# Patient Record
Sex: Female | Born: 1975 | Race: Black or African American | Hispanic: No | Marital: Single | Smoking: Never smoker
Health system: Southern US, Community
[De-identification: ages and names within clinical notes are randomized; demographics above are authoritative.]

## PROBLEM LIST (undated history)

## (undated) ENCOUNTER — Inpatient Hospital Stay (HOSPITAL_COMMUNITY): Payer: Self-pay

## (undated) ENCOUNTER — Ambulatory Visit: Source: Home / Self Care

## (undated) DIAGNOSIS — R51 Headache: Secondary | ICD-10-CM

## (undated) DIAGNOSIS — R519 Headache, unspecified: Secondary | ICD-10-CM

## (undated) DIAGNOSIS — I10 Essential (primary) hypertension: Secondary | ICD-10-CM

## (undated) DIAGNOSIS — I82409 Acute embolism and thrombosis of unspecified deep veins of unspecified lower extremity: Secondary | ICD-10-CM

## (undated) HISTORY — DX: Headache: R51

## (undated) HISTORY — DX: Headache, unspecified: R51.9

## (undated) HISTORY — DX: Acute embolism and thrombosis of unspecified deep veins of unspecified lower extremity: I82.409

## (undated) HISTORY — PX: NO PAST SURGERIES: SHX2092

## (undated) HISTORY — PX: BREAST BIOPSY: SHX20

---

## 1997-11-24 ENCOUNTER — Emergency Department (HOSPITAL_COMMUNITY): Admission: EM | Admit: 1997-11-24 | Discharge: 1997-11-24 | Payer: Self-pay | Admitting: Emergency Medicine

## 1999-01-21 ENCOUNTER — Inpatient Hospital Stay (HOSPITAL_COMMUNITY): Admission: AD | Admit: 1999-01-21 | Discharge: 1999-01-21 | Payer: Self-pay | Admitting: Obstetrics & Gynecology

## 1999-02-21 ENCOUNTER — Encounter: Payer: Self-pay | Admitting: Obstetrics & Gynecology

## 1999-02-21 ENCOUNTER — Inpatient Hospital Stay (HOSPITAL_COMMUNITY): Admission: AD | Admit: 1999-02-21 | Discharge: 1999-02-21 | Payer: Self-pay | Admitting: Obstetrics & Gynecology

## 1999-04-20 ENCOUNTER — Inpatient Hospital Stay (HOSPITAL_COMMUNITY): Admission: AD | Admit: 1999-04-20 | Discharge: 1999-04-20 | Payer: Self-pay | Admitting: *Deleted

## 1999-05-31 ENCOUNTER — Ambulatory Visit (HOSPITAL_COMMUNITY): Admission: RE | Admit: 1999-05-31 | Discharge: 1999-05-31 | Payer: Self-pay | Admitting: Obstetrics

## 1999-05-31 ENCOUNTER — Encounter: Payer: Self-pay | Admitting: *Deleted

## 1999-10-02 ENCOUNTER — Encounter: Payer: Self-pay | Admitting: Obstetrics

## 1999-10-02 ENCOUNTER — Inpatient Hospital Stay (HOSPITAL_COMMUNITY): Admission: AD | Admit: 1999-10-02 | Discharge: 1999-10-02 | Payer: Self-pay | Admitting: Obstetrics

## 2001-11-12 ENCOUNTER — Other Ambulatory Visit: Admission: RE | Admit: 2001-11-12 | Discharge: 2001-11-12 | Payer: Self-pay | Admitting: Obstetrics & Gynecology

## 2003-01-09 ENCOUNTER — Other Ambulatory Visit: Admission: RE | Admit: 2003-01-09 | Discharge: 2003-01-09 | Payer: Self-pay | Admitting: Obstetrics & Gynecology

## 2003-04-23 ENCOUNTER — Emergency Department (HOSPITAL_COMMUNITY): Admission: AD | Admit: 2003-04-23 | Discharge: 2003-04-23 | Payer: Self-pay | Admitting: Family Medicine

## 2003-07-18 ENCOUNTER — Emergency Department (HOSPITAL_COMMUNITY): Admission: EM | Admit: 2003-07-18 | Discharge: 2003-07-18 | Payer: Self-pay | Admitting: Family Medicine

## 2003-07-31 ENCOUNTER — Emergency Department (HOSPITAL_COMMUNITY): Admission: EM | Admit: 2003-07-31 | Discharge: 2003-07-31 | Payer: Self-pay | Admitting: Family Medicine

## 2005-07-10 ENCOUNTER — Inpatient Hospital Stay (HOSPITAL_COMMUNITY): Admission: AD | Admit: 2005-07-10 | Discharge: 2005-07-11 | Payer: Self-pay | Admitting: Gynecology

## 2005-07-30 ENCOUNTER — Inpatient Hospital Stay (HOSPITAL_COMMUNITY): Admission: AD | Admit: 2005-07-30 | Discharge: 2005-07-30 | Payer: Self-pay | Admitting: Obstetrics and Gynecology

## 2005-10-27 ENCOUNTER — Ambulatory Visit (HOSPITAL_COMMUNITY): Admission: RE | Admit: 2005-10-27 | Discharge: 2005-10-27 | Payer: Self-pay

## 2005-11-08 ENCOUNTER — Inpatient Hospital Stay (HOSPITAL_COMMUNITY): Admission: AD | Admit: 2005-11-08 | Discharge: 2005-11-08 | Payer: Self-pay | Admitting: Obstetrics and Gynecology

## 2006-01-23 ENCOUNTER — Inpatient Hospital Stay (HOSPITAL_COMMUNITY): Admission: AD | Admit: 2006-01-23 | Discharge: 2006-01-24 | Payer: Self-pay | Admitting: Obstetrics and Gynecology

## 2006-02-03 ENCOUNTER — Inpatient Hospital Stay (HOSPITAL_COMMUNITY): Admission: AD | Admit: 2006-02-03 | Discharge: 2006-02-03 | Payer: Self-pay | Admitting: Obstetrics and Gynecology

## 2006-03-08 ENCOUNTER — Inpatient Hospital Stay (HOSPITAL_COMMUNITY): Admission: AD | Admit: 2006-03-08 | Discharge: 2006-03-08 | Payer: Self-pay | Admitting: *Deleted

## 2006-03-13 ENCOUNTER — Inpatient Hospital Stay (HOSPITAL_COMMUNITY): Admission: AD | Admit: 2006-03-13 | Discharge: 2006-03-16 | Payer: Self-pay | Admitting: Obstetrics and Gynecology

## 2007-01-05 ENCOUNTER — Ambulatory Visit: Payer: Self-pay | Admitting: Family Medicine

## 2007-01-11 ENCOUNTER — Emergency Department (HOSPITAL_COMMUNITY): Admission: EM | Admit: 2007-01-11 | Discharge: 2007-01-11 | Payer: Self-pay | Admitting: Family Medicine

## 2007-02-15 ENCOUNTER — Ambulatory Visit: Payer: Self-pay | Admitting: Family Medicine

## 2007-03-16 ENCOUNTER — Ambulatory Visit: Payer: Self-pay | Admitting: Family Medicine

## 2007-03-16 ENCOUNTER — Encounter: Payer: Self-pay | Admitting: Family Medicine

## 2007-03-16 ENCOUNTER — Other Ambulatory Visit: Admission: RE | Admit: 2007-03-16 | Discharge: 2007-03-16 | Payer: Self-pay | Admitting: Family Medicine

## 2007-03-30 ENCOUNTER — Ambulatory Visit: Payer: Self-pay | Admitting: Family Medicine

## 2007-06-21 ENCOUNTER — Ambulatory Visit: Payer: Self-pay | Admitting: Family Medicine

## 2007-06-30 ENCOUNTER — Emergency Department (HOSPITAL_COMMUNITY): Admission: EM | Admit: 2007-06-30 | Discharge: 2007-06-30 | Payer: Self-pay | Admitting: Emergency Medicine

## 2007-09-14 ENCOUNTER — Ambulatory Visit: Payer: Self-pay | Admitting: Family Medicine

## 2007-12-07 ENCOUNTER — Ambulatory Visit: Payer: Self-pay | Admitting: Family Medicine

## 2008-01-05 ENCOUNTER — Ambulatory Visit: Payer: Self-pay | Admitting: Family Medicine

## 2008-02-28 ENCOUNTER — Ambulatory Visit: Payer: Self-pay | Admitting: Family Medicine

## 2008-03-02 ENCOUNTER — Ambulatory Visit: Payer: Self-pay | Admitting: Family Medicine

## 2008-03-15 ENCOUNTER — Ambulatory Visit: Payer: Self-pay | Admitting: Family Medicine

## 2008-05-09 ENCOUNTER — Other Ambulatory Visit: Admission: RE | Admit: 2008-05-09 | Discharge: 2008-05-09 | Payer: Self-pay | Admitting: Family Medicine

## 2008-05-09 ENCOUNTER — Encounter: Payer: Self-pay | Admitting: Family Medicine

## 2008-05-09 ENCOUNTER — Ambulatory Visit: Payer: Self-pay | Admitting: Family Medicine

## 2008-05-26 ENCOUNTER — Ambulatory Visit: Payer: Self-pay | Admitting: Family Medicine

## 2008-08-17 ENCOUNTER — Ambulatory Visit: Payer: Self-pay | Admitting: Family Medicine

## 2008-08-31 ENCOUNTER — Ambulatory Visit: Payer: Self-pay | Admitting: Family Medicine

## 2008-10-20 ENCOUNTER — Ambulatory Visit: Payer: Self-pay | Admitting: Family Medicine

## 2008-10-24 ENCOUNTER — Ambulatory Visit: Payer: Self-pay | Admitting: Family Medicine

## 2008-11-08 ENCOUNTER — Ambulatory Visit: Payer: Self-pay | Admitting: Family Medicine

## 2009-01-26 ENCOUNTER — Ambulatory Visit: Payer: Self-pay | Admitting: Family Medicine

## 2009-01-30 ENCOUNTER — Ambulatory Visit: Payer: Self-pay | Admitting: Family Medicine

## 2009-03-13 ENCOUNTER — Other Ambulatory Visit: Admission: RE | Admit: 2009-03-13 | Discharge: 2009-03-13 | Payer: Self-pay | Admitting: Family Medicine

## 2009-03-13 ENCOUNTER — Ambulatory Visit: Payer: Self-pay | Admitting: Family Medicine

## 2009-05-29 ENCOUNTER — Ambulatory Visit: Payer: Self-pay | Admitting: Physician Assistant

## 2010-05-10 ENCOUNTER — Encounter (INDEPENDENT_AMBULATORY_CARE_PROVIDER_SITE_OTHER): Payer: 59 | Admitting: Medical

## 2010-05-10 ENCOUNTER — Other Ambulatory Visit (HOSPITAL_COMMUNITY)
Admission: RE | Admit: 2010-05-10 | Discharge: 2010-05-10 | Disposition: A | Discharge status: Home / Self Care | Payer: 59 | Source: Ambulatory Visit | Attending: Family Medicine | Admitting: Family Medicine

## 2010-05-10 ENCOUNTER — Other Ambulatory Visit: Payer: Self-pay | Admitting: Medical

## 2010-05-10 DIAGNOSIS — R82998 Other abnormal findings in urine: Secondary | ICD-10-CM

## 2010-05-10 DIAGNOSIS — Z124 Encounter for screening for malignant neoplasm of cervix: Secondary | ICD-10-CM | POA: Insufficient documentation

## 2010-05-10 DIAGNOSIS — Z0000 Encounter for general adult medical examination without abnormal findings: Secondary | ICD-10-CM

## 2010-05-10 DIAGNOSIS — N979 Female infertility, unspecified: Secondary | ICD-10-CM

## 2010-05-14 ENCOUNTER — Telehealth: Payer: Self-pay | Admitting: *Deleted

## 2010-05-14 NOTE — Telephone Encounter (Addendum)
Message copied by Ellsworth Lennox on Tue May 14, 2010  9:50 AM ------      Message from: Aleen Campi, DAVID      Created: Tue May 14, 2010  8:39 AM      Regarding: lab results       Lab results look good - her good chol is a little low, but blood count, lytes, liver, kidney, thyroid, cholesterol, urine culture ALL normal.  As we discussed, work on healthy diet, <1800 cal per day until she reaches her weight loss goal, get regular exercise.  We will with pap results.  Have her come in for weight check/nurse visit in 75mo.  Pt notified of lab results.  Pt will call and schedule a nurse visit.  CM, LPN

## 2010-05-15 ENCOUNTER — Telehealth: Payer: Self-pay | Admitting: *Deleted

## 2010-05-15 NOTE — Telephone Encounter (Addendum)
Message copied by Ellsworth Lennox on Wed May 15, 2010  3:22 PM ------      Message from: Aleen Campi, DAVID      Created: Wed May 15, 2010  2:37 PM       Pap normal, repeat 1-2 years  Pt informed of pap smear results.  CM, LPN

## 2010-05-17 ENCOUNTER — Other Ambulatory Visit: Payer: Self-pay | Admitting: Medical

## 2010-05-17 MED ORDER — SUMATRIPTAN SUCCINATE 100 MG PO TABS: ORAL_TABLET | ORAL | Status: DC

## 2010-05-24 NOTE — H&P (Signed)
Carmen Lewis, Carmen Lewis               ACCOUNT NO.:  [id number]   MEDICAL RECORD NO.:  [id number]          PATIENT TYPE:  MAT   LOCATION:  [facility]                          FACILITY:  [facility]   PHYSICIAN:  [place] B. Earlene Plater, M.D.  DATE OF BIRTH:  1975/03/21   DATE OF ADMISSION:  07/10/2005  DATE OF DISCHARGE:                                HISTORY & PHYSICAL   CHIEF COMPLAINT:  Lower abdominal pain.   HISTORY OF PRESENT ILLNESS:  A 35 year old African-American female, gravida  2, para 1, A1; with an uncertain last menstrual period sometime at the first  of June.  She presents with lower abdominal pain today, with associated  headaches.  She has had no bleeding.  The pain is described as crampy, 5/10  intensity.  She is otherwise healthy and has had no other medical problems.  She is just no sure if her last menses was in May or June.  No previous  history of pelvic infection, pelvic surgery, infertility or ectopic  pregnancy.   PAST MEDICAL HISTORY:  Otherwise negative.   PAST OBSTETRICAL HISTORY:  Vaginal delivery x1.  Therapeutic abortion x1.   MEDICATIONS:  None.   ALLERGIES:  NONE.   SOCIAL HISTORY:  No alcohol or tobacco or other drugs.   REVIEW OF SYSTEMS:  No urinary symptoms.  The patient states  no other  problems currently.   PHYSICAL EXAMINATION:  VITAL SIGNS:  Temperature 98.3, pulse 74,  respirations 18, blood pressure 119/64.  GENERAL:  Alert and oriented; no acute distress.  SKIN:  Warm and dry; no lesions.  ABDOMEN:  Soft, nondistended and nontender.  PELVIC:  Normal external female genitalia. Vagina and cervix are normal.  Uterus is not enlarged.  There are no adnexal masses.  The patient is  slightly tender in the left adnexal area.  Bladder base is nontender.   ASSESSMENT:  Early pregnancy, lower abdominal pain.   PLAN:  Check quantitative HCG, and if above __________  is at levels, will  order a pelvic ultrasound.  If not, we will follow serial HCG for a trend  with bleeding and pain precautions to be given.     [name] B. Earlene Plater, M.D.  Electronically Signed    WBD/MEDQ  D:  07/10/2005  T:  07/10/2005  Job:  [id number]

## 2010-05-24 NOTE — H&P (Signed)
Carmen Lewis, Carmen Lewis               ACCOUNT NO.:  [id number]   MEDICAL RECORD NO.:  [id number]          PATIENT TYPE:  MAT   LOCATION:  [facility]                          [facility]   PHYSICIAN:  [place] B. Earlene Plater, M.D.  DATE OF BIRTH:  Jun 27, 1975   DATE OF ADMISSION:  07/10/2005  DATE OF DISCHARGE:                                HISTORY & PHYSICAL   Audio too short to transcribe (less than 5 seconds)      [place] B. Earlene Plater, M.D.     WBD/MEDQ  D:  07/10/2005  T:  07/10/2005  Job:  [id number]

## 2010-07-07 DIAGNOSIS — I82409 Acute embolism and thrombosis of unspecified deep veins of unspecified lower extremity: Secondary | ICD-10-CM

## 2010-07-07 HISTORY — DX: Acute embolism and thrombosis of unspecified deep veins of unspecified lower extremity: I82.409

## 2010-07-15 ENCOUNTER — Emergency Department (HOSPITAL_COMMUNITY)
Admission: EM | Admit: 2010-07-15 | Discharge: 2010-07-16 | Disposition: A | Discharge status: Home / Self Care | Payer: 59 | Attending: Emergency Medicine | Admitting: Emergency Medicine

## 2010-07-15 ENCOUNTER — Emergency Department (HOSPITAL_COMMUNITY): Payer: 59

## 2010-07-15 DIAGNOSIS — M25559 Pain in unspecified hip: Secondary | ICD-10-CM | POA: Insufficient documentation

## 2010-07-15 DIAGNOSIS — M25579 Pain in unspecified ankle and joints of unspecified foot: Secondary | ICD-10-CM | POA: Insufficient documentation

## 2010-07-15 DIAGNOSIS — M25519 Pain in unspecified shoulder: Secondary | ICD-10-CM | POA: Insufficient documentation

## 2010-07-15 DIAGNOSIS — R10819 Abdominal tenderness, unspecified site: Secondary | ICD-10-CM | POA: Insufficient documentation

## 2010-07-15 DIAGNOSIS — Y929 Unspecified place or not applicable: Secondary | ICD-10-CM | POA: Insufficient documentation

## 2010-07-15 LAB — URINALYSIS, ROUTINE W REFLEX MICROSCOPIC
Bilirubin Urine: NEGATIVE
Glucose, UA: NEGATIVE mg/dL
Ketones, ur: NEGATIVE mg/dL
Leukocytes, UA: NEGATIVE
Protein, ur: NEGATIVE mg/dL
Specific Gravity, Urine: 1.021 (ref 1.005–1.030)

## 2010-07-15 LAB — POCT I-STAT, CHEM 8
Calcium, Ion: 1.11 mmol/L — ABNORMAL LOW (ref 1.12–1.32)
HCT: 38 % (ref 36.0–46.0)
Potassium: 3.4 mEq/L — ABNORMAL LOW (ref 3.5–5.1)

## 2010-07-15 LAB — URINE MICROSCOPIC-ADD ON

## 2010-07-15 MED ORDER — IOHEXOL 300 MG/ML  SOLN
100.0000 mL | Freq: Once | INTRAMUSCULAR | Status: AC | PRN
  Administered 2010-07-15: 100 mL via INTRAVENOUS

## 2010-07-28 ENCOUNTER — Emergency Department (HOSPITAL_COMMUNITY): Payer: Self-pay

## 2010-07-28 ENCOUNTER — Emergency Department (HOSPITAL_COMMUNITY)
Admission: EM | Admit: 2010-07-28 | Discharge: 2010-07-28 | Disposition: A | Discharge status: Home / Self Care | Payer: Self-pay | Attending: Emergency Medicine | Admitting: Emergency Medicine

## 2010-07-28 DIAGNOSIS — I82409 Acute embolism and thrombosis of unspecified deep veins of unspecified lower extremity: Secondary | ICD-10-CM | POA: Insufficient documentation

## 2010-07-28 DIAGNOSIS — I824Y9 Acute embolism and thrombosis of unspecified deep veins of unspecified proximal lower extremity: Secondary | ICD-10-CM

## 2010-07-28 DIAGNOSIS — M79609 Pain in unspecified limb: Secondary | ICD-10-CM | POA: Insufficient documentation

## 2010-07-28 DIAGNOSIS — Z79899 Other long term (current) drug therapy: Secondary | ICD-10-CM | POA: Insufficient documentation

## 2010-07-28 LAB — RENAL FUNCTION PANEL
Albumin: 3.8 g/dL (ref 3.5–5.2)
BUN: 14 mg/dL (ref 6–23)
CO2: 26 mEq/L (ref 19–32)
Calcium: 9.8 mg/dL (ref 8.4–10.5)
Phosphorus: 2.9 mg/dL (ref 2.3–4.6)
Potassium: 4.1 mEq/L (ref 3.5–5.1)
Sodium: 138 mEq/L (ref 135–145)

## 2010-07-28 LAB — APTT: aPTT: 41 seconds — ABNORMAL HIGH (ref 24–37)

## 2010-07-28 LAB — PROTIME-INR: INR: 1.06 (ref 0.00–1.49)

## 2010-07-28 MED ORDER — IOHEXOL 300 MG/ML  SOLN
100.0000 mL | Freq: Once | INTRAMUSCULAR | Status: AC | PRN
  Administered 2010-07-28: 100 mL via INTRAVENOUS

## 2010-07-29 ENCOUNTER — Encounter: Payer: Self-pay | Admitting: Medical

## 2010-07-29 ENCOUNTER — Ambulatory Visit (INDEPENDENT_AMBULATORY_CARE_PROVIDER_SITE_OTHER): Payer: Self-pay | Admitting: Medical

## 2010-07-29 VITALS — BP 132/90 | HR 76 | Temp 98.1°F | Ht 64.0 in | Wt 180.0 lb

## 2010-07-29 DIAGNOSIS — I82409 Acute embolism and thrombosis of unspecified deep veins of unspecified lower extremity: Secondary | ICD-10-CM

## 2010-07-29 MED ORDER — ENOXAPARIN SODIUM 80 MG/0.8ML ~~LOC~~ SOLN: SUBCUTANEOUS | Status: DC

## 2010-07-29 MED ORDER — WARFARIN SODIUM 5 MG PO TABS: 5.0000 mg | ORAL_TABLET | Freq: Every day | ORAL | Status: DC
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
## 2010-07-29 NOTE — Patient Instructions (Signed)
Deep Vein Thrombosis A deep vein thrombosis (DVT) is a blood clot (thrombus) that develops in a deep vein. A DVT is a clot in the deep, larger veins of the leg, arm, or pelvis. These are more dangerous than clots that might form in veins on the surface of the body. Deep vein thrombosis can lead to complications if the clot breaks off and travels in the bloodstream to the lungs. CAUSES Blood clots form in a vein for different reasons. Usually several things cause blood clots. They include:  The flow of blood slows down.   The inside of the vein is damaged in some way.   The person has a condition that makes blood clot more easily. These conditions may include:   Older age (especially over 75 years old).   Having a history of blood clots.   Having major or lengthy surgery. Hip surgery is particularly high-risk.   Breaking a hip or leg.   Sitting or lying still for a long time.   Cancer or cancer treatment.   Having a long, thin tube (catheter) placed inside a vein during a medical procedure.   Being overweight (obese).   Pregnancy and childbirth.   Medicines with estrogen.   Smoking.   Other circulation or heart problems.  SYMPTOMS When a clot forms, it can either partially or totally block the blood flow in that vein. Symptoms of a DVT can include:  Swelling of the leg or arm, especially if one side is much worse.   Warmth and redness of the leg or arm, especially if one side is much worse.   Pain in an arm or leg. If the clot is in the leg, symptoms may be more noticeable or worse when standing or walking.  If the blood clot travels to the lung, it may cause:  Shortness of breath.   Chest pain. The pain may be worsened by deep breaths.   Coughing up thick mucus (phlegm), possibly flecked with blood.  Anyone with these symptoms should get emergency medical treatment right away. Call your local emergency services (911 in [place] if you have these symptoms. DIAGNOSIS If  a DVT is suspected, your caregiver will take a full medical history. He or she will also perform a physical exam. Tests that also may be required include:  Studies of the clotting properties of the blood.   An ultrasound scan.   X-rays to show the flow of blood when special dye is injected into the veins (venography).   Studies of your lungs if you have any chest symptoms.  PREVENTION  Exercise the legs regularly. Take a brisk 30 minute walk every day.   Maintain a weight that is appropriate for your height.   Avoid sitting or lying in bed for long periods of time without moving your legs.   Women, particularly those over the age of 35, should consider the risks and benefits of taking estrogen medicines, including birth control pills.   Do not smoke, especially if you take estrogen medicines.   Long-distance travel can increase your risk. You should exercise your legs by walking or pumping the muscles every hour.   In hospital prevention:   Prevention may include medical and nonmedical measures.  TREATMENT  The most common treatment for DVT is blood thinning (anticoagulant) medicine, which reduces the blood's tendency to clot. Anticoagulants can stop new blood clots from forming and old ones from growing. They cannot dissolve existing clots. Your body does this by itself over time.   Anticoagulants can be given by mouth, by intravenous (IV) access, or by injection. Your caregiver will determine the best program for you.   Less commonly, clot-dissolving drugs (thrombolytics) are used to dissolve a DVT. They carry a high risk of bleeding, so they are used mainly in severe cases.   Very rarely, a blood clot in the leg needs to be removed surgically.   If you are unable to take anticoagulants, your caregiver may arrange for you to have a filter placed in a main vein in your belly (abdomen). This filter prevents clots from traveling to your lungs.  HOME CARE INSTRUCTIONS  Take all  medicines prescribed by your caregiver. Follow the directions carefully.   You will most likely continue taking anticoagulants after you leave the hospital. Your caregiver will advise you on the length of treatment (usually 3 to 6 months, sometimes for life).   Taking too much or too little of an anticoagulant is dangerous. While taking this type of medicine, you will need to have regular blood tests to be sure the dose is correct. The dose can change for many reasons. It is critically important that you take this medicine exactly as prescribed, and that you have blood tests exactly as directed.   Many foods can interfere with anticoagulants. These include foods high in vitamin K, such as spinach, kale, broccoli, cabbage, collard and turnip greens, Brussels sprouts, peas, cauliflower, seaweed, parsley, beef and pork liver, green tea, and soybean oil. Your caregiver should discuss limits on these foods with you or you should arrange a visit with a dietician to answer your questions.   Many medicines can interfere with anticoagulants. You must tell your caregiver about any and all medicines you take. This includes all vitamins and supplements. Be especially cautious with aspirin and anti-inflammatory medicines. Ask your caregiver before taking these.   Anticoagulants can have side effects, mostly excessive bruising or bleeding. You will need to hold pressure over cuts for longer than usual. Avoid alcoholic drinks or consume only very small amounts while taking this medicine.   If you are taking an anticoagulant:   Wear a medical alert bracelet.   Notify your dentist or other caregivers before procedures.   Avoid contact sports.   Ask your caregiver how soon you can go back to normal activities. Not being active can lead to new clots. Ask for a list of what you should and should not do.   Exercise your lower leg muscles. This is important while traveling.   You may need to wear compression  stockings. These are tight elastic stockings that apply pressure to the lower legs. This can help keep the blood in the legs from clotting.   If you are a smoker, you should quit.   Learn as much as you can about DVT.  SEEK MEDICAL CARE IF:  You have unusual bruising or any bleeding problems.   The swelling or pain in your affected arm or leg is not gradually improving.   You anticipate surgery or long-distance travel. You should get specific advice on DVT prevention.   You discover other family members with blood clots. This may require further testing for inherited diseases or conditions.  SEEK IMMEDIATE MEDICAL CARE IF:  You develop chest pain.   You develop severe shortness of breath.   You begin to cough up bloody mucus or phlegm (sputum).   You feel dizzy or faint.   You develop swelling or pain in the leg.   You have   breathing problems after traveling.  MAKE SURE YOU:  Understand these instructions.   Will watch your condition.   Will get help right away if you are not doing well or get worse.  Document Released: 12/23/2004 Document Re-Released: 03/19/2009 [facility] Patient Information 2011 [place], [place].

## 2010-07-31 ENCOUNTER — Other Ambulatory Visit: Payer: Self-pay

## 2010-07-31 ENCOUNTER — Emergency Department (HOSPITAL_COMMUNITY)
Admission: EM | Admit: 2010-07-31 | Discharge: 2010-07-31 | Disposition: A | Discharge status: Home / Self Care | Payer: Self-pay | Attending: Emergency Medicine | Admitting: Emergency Medicine

## 2010-07-31 ENCOUNTER — Ambulatory Visit: Payer: Self-pay | Admitting: Medical

## 2010-07-31 DIAGNOSIS — M949 Disorder of cartilage, unspecified: Secondary | ICD-10-CM | POA: Insufficient documentation

## 2010-07-31 DIAGNOSIS — Z7901 Long term (current) use of anticoagulants: Secondary | ICD-10-CM | POA: Insufficient documentation

## 2010-07-31 DIAGNOSIS — M7989 Other specified soft tissue disorders: Secondary | ICD-10-CM | POA: Insufficient documentation

## 2010-07-31 DIAGNOSIS — M899 Disorder of bone, unspecified: Secondary | ICD-10-CM | POA: Insufficient documentation

## 2010-07-31 DIAGNOSIS — I82409 Acute embolism and thrombosis of unspecified deep veins of unspecified lower extremity: Secondary | ICD-10-CM | POA: Insufficient documentation

## 2010-07-31 LAB — CBC
HCT: 40.3 % (ref 36.0–46.0)
MCH: 30.2 pg (ref 26.0–34.0)
MCV: 87.4 fL (ref 78.0–100.0)

## 2010-07-31 LAB — POCT I-STAT, CHEM 8
BUN: 12 mg/dL (ref 6–23)
Creatinine, Ser: 0.7 mg/dL (ref 0.50–1.10)
HCT: 42 % (ref 36.0–46.0)
Potassium: 4.1 mEq/L (ref 3.5–5.1)
Sodium: 140 mEq/L (ref 135–145)

## 2010-07-31 LAB — DIFFERENTIAL
Basophils Absolute: 0.1 10*3/uL (ref 0.0–0.1)
Eosinophils Absolute: 0.2 10*3/uL (ref 0.0–0.7)
Monocytes Absolute: 0.8 10*3/uL (ref 0.1–1.0)
Monocytes Relative: 8 % (ref 3–12)
Neutro Abs: 4.9 10*3/uL (ref 1.7–7.7)

## 2010-08-02 ENCOUNTER — Telehealth: Payer: Self-pay | Admitting: *Deleted

## 2010-08-02 NOTE — Telephone Encounter (Addendum)
Message copied by Dorthula Perfect on Fri Aug 02, 2010  9:49 AM ------      Message from: Aleen Campi, DAVID S      Created: Thu Aug 01, 2010  4:51 PM       pls call and advise that I apologize that I didn't get to see her yesterday.  Certainly a DVT is serious and since things were worsening, going to the ED was appropriate.              I reviewed the ED note, and it appears that they had her increase Coumadin to 7.5mg  daily and dispensed more Lovenox.  Have her come in Monday for PT/INR, and make sure Dr. Susann Givens sees result so he can adjust the Coumadin.   Called and apologized to pt for not being able to be seen by Kristian Covey, PA.  Pt agreed to come in for nurse visit Monday 08-05-10 at 1:30pm for PT/INR stat labs.  Will let Dr. Susann Givens know results so that if Coumadin needs to be adjusted he can do so.  CM, LPN

## 2010-08-05 ENCOUNTER — Other Ambulatory Visit (INDEPENDENT_AMBULATORY_CARE_PROVIDER_SITE_OTHER): Payer: Self-pay

## 2010-08-05 DIAGNOSIS — Z7901 Long term (current) use of anticoagulants: Secondary | ICD-10-CM

## 2010-08-05 NOTE — Progress Notes (Signed)
Called to inform pt of PT/INR results.  Pt was informed to stop Lovenox, continue Coumadin 5 mg and eat the same amount of greens that she has been eating.  Pt is scheduled to return in 2 weeks on 08-19-10 at 9 am for nurse visit to recheck PT/INR per JCL.  CM, LPN  [id number]

## 2010-08-14 ENCOUNTER — Encounter: Payer: Self-pay | Admitting: Family Medicine

## 2010-08-19 ENCOUNTER — Telehealth: Payer: Self-pay | Admitting: *Deleted

## 2010-08-19 ENCOUNTER — Other Ambulatory Visit: Payer: No Typology Code available for payment source

## 2010-08-19 DIAGNOSIS — Z7901 Long term (current) use of anticoagulants: Secondary | ICD-10-CM

## 2010-08-19 LAB — PROTIME-INR
INR: 5.87 (ref <1.50)
Prothrombin Time: 53 seconds — ABNORMAL HIGH (ref 11.6–15.2)

## 2010-08-22 ENCOUNTER — Telehealth: Payer: Self-pay | Admitting: *Deleted

## 2010-08-22 ENCOUNTER — Other Ambulatory Visit: Payer: No Typology Code available for payment source

## 2010-08-22 DIAGNOSIS — Z7901 Long term (current) use of anticoagulants: Secondary | ICD-10-CM

## 2010-08-22 LAB — PROTIME-INR
INR: 2.47 — ABNORMAL HIGH (ref <1.50)
Prothrombin Time: 27 seconds — ABNORMAL HIGH (ref 11.6–15.2)

## 2010-08-22 MED ORDER — WARFARIN SODIUM 4 MG PO TABS: 4.0000 mg | ORAL_TABLET | Freq: Every day | ORAL | Status: DC

## 2010-08-22 NOTE — Telephone Encounter (Signed)
done

## 2010-08-22 NOTE — Telephone Encounter (Signed)
Done

## 2010-08-23 NOTE — Telephone Encounter (Signed)
My understanding is that Dr. Susann Givens changed her Coumadin to 4mg  daily as of yesterday's INR results.  Unless he told her otherwise, lets recheck lab in 1 week.  Hopefully it will be stable at that time.  Regarding Fertile Vit A supplement, that is fine.  Is she taking this for a specific reason or concern?     Not sure if this is the issue, but if she is trying to get pregnant, I would consider trying to wait after 4-6 months given the clot and coumadin therapy.  What questions dose she have?

## 2010-08-23 NOTE — Telephone Encounter (Signed)
Spoke with pt and advised her to schedule nurse visit to recheck coumadin level in 1 week.  Also advised pt that she could take Fertile A vitamin supplement.  Pt was also advised not to get pregnant right now to wait 4-6 months after coumadin therapy.  Pt stated that her and her husband has been trying for 2 yrs to get pregnant.  Pt spoke with Kristian Covey. PA regarding above message.  CM, LPN

## 2010-08-29 ENCOUNTER — Other Ambulatory Visit: Payer: No Typology Code available for payment source

## 2010-08-29 DIAGNOSIS — Z7901 Long term (current) use of anticoagulants: Secondary | ICD-10-CM

## 2010-08-29 LAB — PROTIME-INR
INR: 1.69 — ABNORMAL HIGH (ref <1.50)
Prothrombin Time: 20.5 seconds — ABNORMAL HIGH (ref 11.6–15.2)

## 2010-08-30 ENCOUNTER — Telehealth: Payer: Self-pay | Admitting: Family Medicine

## 2010-09-02 NOTE — Telephone Encounter (Signed)
i called and spoke to pt.  Her last INR was subtherapeutic.  I changed her to Coumadin 5mg  on Monday and Thursday, 4mg  all other days. Recheck INR in 10 days.

## 2010-09-12 ENCOUNTER — Other Ambulatory Visit: Payer: No Typology Code available for payment source

## 2010-09-12 ENCOUNTER — Telehealth: Payer: Self-pay | Admitting: Medical

## 2010-09-12 DIAGNOSIS — Z79899 Other long term (current) drug therapy: Secondary | ICD-10-CM

## 2010-09-12 NOTE — Telephone Encounter (Signed)
Called pt about pt inr to recheck in 2 week

## 2010-09-12 NOTE — Telephone Encounter (Signed)
PT STATES THAT SHE WAS INFORMED THAT IT IS NOT A GOOD IDEA FOR HER TO GET PREGNANT.  PT WOULD LIKE TO DISCUSS HER OPTIONS WITH YOU. PLEASE CALL PT.

## 2010-09-26 ENCOUNTER — Other Ambulatory Visit: Payer: No Typology Code available for payment source

## 2010-09-26 ENCOUNTER — Telehealth: Payer: Self-pay | Admitting: Medical

## 2010-09-26 DIAGNOSIS — Z7901 Long term (current) use of anticoagulants: Secondary | ICD-10-CM

## 2010-09-26 LAB — GC/CHLAMYDIA PROBE AMP, GENITAL: Chlamydia, DNA Probe: POSITIVE — AB

## 2010-09-26 LAB — WET PREP, GENITAL
Trich, Wet Prep: NONE SEEN
Yeast Wet Prep HPF POC: NONE SEEN

## 2010-09-26 LAB — POCT URINALYSIS DIP (DEVICE)
Bilirubin Urine: NEGATIVE
Glucose, UA: NEGATIVE
Ketones, ur: NEGATIVE
Nitrite: NEGATIVE
Specific Gravity, Urine: 1.015

## 2010-09-26 NOTE — Telephone Encounter (Signed)
PT WAS INFORMED THAT SHE SHOULD NOT GET PREG. SHE WOULD LIKE TO DISCUSS OPTIONS FOR BIRTH CONTROL POSS [facility] PLEASE CALL PT

## 2010-09-26 NOTE — Telephone Encounter (Signed)
Patient was advised on what Vincenza Hews suggested and she states she will stop by tomorrow afternoon to complete the test. CLS

## 2010-09-26 NOTE — Telephone Encounter (Signed)
pls pull her chart then send this back to me

## 2010-09-26 NOTE — Telephone Encounter (Signed)
CHART HAS BEEN PULLED. MESSAGE FORWARED.

## 2010-09-26 NOTE — Telephone Encounter (Addendum)
Message copied by Janeice Robinson on Thu Sep 26, 2010  2:36 PM ------      Message from: [place], DAVID S      Created: Thu Sep 26, 2010  2:04 PM       pls call and verify dose of Coumadin currently.  Pls document what she is taking.  INR looks good today, so c/t same dose, repeat in 28mo.              I saw the other note about contraception.  She has been on Nuvaring prior.  If this worked ok for her, then [employer] send script to restart, but have her come in for urine pregnancy first.  I have to first verify she is in fact NOT pregnant.  See if she has any other questions.  If so, get me on the phone for her.  i spoke with the patient she states that she is 4mg  and 5mg  every other day of her coumadin. Patient states that she started her cycle on Saturday. She doesn't want to come in for pregnancy test.  cls

## 2010-09-26 NOTE — Telephone Encounter (Signed)
i will be glad to start her on Nuvaring, but I have to document a negative pregnancy test.  She can come by for urine at her convenience.

## 2010-09-27 ENCOUNTER — Other Ambulatory Visit (INDEPENDENT_AMBULATORY_CARE_PROVIDER_SITE_OTHER): Payer: No Typology Code available for payment source

## 2010-09-27 DIAGNOSIS — Z309 Encounter for contraceptive management, unspecified: Secondary | ICD-10-CM

## 2010-09-29 ENCOUNTER — Other Ambulatory Visit: Payer: Self-pay | Admitting: Medical

## 2010-09-29 MED ORDER — ETONOGESTREL-ETHINYL ESTRADIOL 0.12-0.015 MG/24HR VA RING: VAGINAL_RING | VAGINAL | Status: DC

## 2010-09-30 ENCOUNTER — Telehealth: Payer: Self-pay | Admitting: Family Medicine

## 2010-09-30 NOTE — Telephone Encounter (Addendum)
Message copied by Janeice Robinson on Mon Sep 30, 2010 12:01 PM ------      Message from: Jac Canavan      Created: Sun Sep 29, 2010  5:11 PM       nuvaring sent to pharmacy.  Pregnancy test was negative.  Patient was notified of her lab test and that her rx was called into pharmacy. cls

## 2010-10-03 LAB — WET PREP, GENITAL: Yeast Wet Prep HPF POC: NONE SEEN

## 2010-10-03 LAB — POCT URINALYSIS DIP (DEVICE)
Bilirubin Urine: NEGATIVE
Glucose, UA: NEGATIVE
Ketones, ur: NEGATIVE
Nitrite: NEGATIVE

## 2010-10-03 LAB — GC/CHLAMYDIA PROBE AMP, GENITAL: Chlamydia, DNA Probe: NEGATIVE

## 2010-10-03 LAB — POCT PREGNANCY, URINE: Preg Test, Ur: NEGATIVE

## 2010-10-21 ENCOUNTER — Telehealth: Payer: Self-pay | Admitting: Family Medicine

## 2010-10-21 NOTE — Telephone Encounter (Signed)
I called pt.  Advised she is due back at this time for repeat PT/INR.  She did use Tylenol with some improvement in headache.  Advised if headache worse or not improving, call/return.

## 2010-10-25 ENCOUNTER — Other Ambulatory Visit: Payer: No Typology Code available for payment source

## 2010-10-25 DIAGNOSIS — Z7901 Long term (current) use of anticoagulants: Secondary | ICD-10-CM | POA: Insufficient documentation

## 2010-10-25 NOTE — Progress Notes (Signed)
Patient states that she takes 4 one day and 5 the next day. She said to let you know that she didn't have anything to eat or drink this morning. She said to ask you about the U/S of her leg. CLs

## 2010-10-25 NOTE — Progress Notes (Signed)
Patient was notified of the change in her medication CLS

## 2010-10-28 NOTE — Progress Notes (Signed)
Patient was notified of Pt results and the change in meds. CLS

## 2010-11-14 ENCOUNTER — Telehealth: Payer: Self-pay | Admitting: Medical

## 2010-11-14 NOTE — Telephone Encounter (Signed)
DONE

## 2010-11-18 ENCOUNTER — Other Ambulatory Visit: Payer: No Typology Code available for payment source

## 2010-11-18 DIAGNOSIS — I82409 Acute embolism and thrombosis of unspecified deep veins of unspecified lower extremity: Secondary | ICD-10-CM

## 2010-11-18 LAB — PROTIME-INR: INR: 1.54 — ABNORMAL HIGH (ref <1.50)

## 2010-11-26 ENCOUNTER — Telehealth: Payer: Self-pay | Admitting: Medical

## 2010-11-26 NOTE — Telephone Encounter (Signed)
PATIENT WOULD LIKE TO KNOW WHAT OVER THE COUNTER COLD MEDICATION CAN SHE TAKE SINCE SHE IS ON COUMADIN. CLS

## 2010-11-27 NOTE — Telephone Encounter (Signed)
PATIENT WAS NOTIFIED OF WHAT MEDICATIONS SHE COULD TAKE OTC. CLS

## 2010-11-27 NOTE — Telephone Encounter (Signed)
Mucinex DM, Coricidin HBP are ok.

## 2010-12-03 ENCOUNTER — Other Ambulatory Visit: Payer: No Typology Code available for payment source

## 2010-12-03 DIAGNOSIS — Z7901 Long term (current) use of anticoagulants: Secondary | ICD-10-CM

## 2010-12-03 LAB — PROTIME-INR: Prothrombin Time: 17.8 seconds — ABNORMAL HIGH (ref 11.6–15.2)

## 2010-12-04 ENCOUNTER — Telehealth: Payer: Self-pay | Admitting: Internal Medicine

## 2010-12-04 NOTE — Telephone Encounter (Signed)
Pt is taking 5mg  of coumadin. she is taking it every day at different times if she don't forget to. She has forgot to take it one or 2 times.

## 2010-12-04 NOTE — Telephone Encounter (Signed)
Message copied by Joslyn Hy on Wed Dec 04, 2010 10:00 AM ------      Message from: [place], Kermit Balo      Created: Tue Dec 03, 2010  9:30 PM       pls call and verify dose of Coumadin.  What strength?  How is she taking it?  Is she forgetting to take some doses?  Her INR was under corrected.  Let me know so I or another provider can adjust the dose today.

## 2010-12-05 ENCOUNTER — Other Ambulatory Visit: Payer: Self-pay | Admitting: Medical

## 2010-12-05 ENCOUNTER — Telehealth: Payer: Self-pay | Admitting: Family Medicine

## 2010-12-05 MED ORDER — WARFARIN SODIUM 1 MG PO TABS: 1.0000 mg | ORAL_TABLET | Freq: Every day | ORAL | Status: DC

## 2010-12-05 NOTE — Telephone Encounter (Signed)
PATIENT WAS NOTIFIED OF THE CHANGE I  HER MEDICATION CAN YOU PLEASE SEND 1MG  TO THE PHARMACY. CLS

## 2010-12-05 NOTE — Telephone Encounter (Signed)
PATIENT WAS NOTIFIED ABOUT MAKING AN APPT. AND SHE SCHEDULED HER APPT. FOR 12/16/10. CLS  PATIENT WAS ALSO NOTIFIED TO INCREASE HER COUMADIN TO 6MG  AT NIGHT. SHE DOES NOT HAVE A 1MG  PILL SO CAN YOU PLEASE SEND THE 1MG  TO THE PHARMACY. CLS

## 2010-12-05 NOTE — Telephone Encounter (Signed)
Increase to 6mg  daily, one of the 5mg  tablets, + a 1 mg tablet.  If she doesn't have any 1mg  tablets, let me know so I can send to pharmacy.  Try and remember to take daily at bedtime such as when she brushes her teeth

## 2010-12-05 NOTE — Telephone Encounter (Signed)
Message copied by Janeice Robinson on Thu Dec 05, 2010 10:07 AM ------      Message from: [place], DAVID S      Created: Thu Dec 05, 2010  7:44 AM       See other msg from today.  Also, since she is almost 5 months out from the diagnosis of DVT, lets have her come in for OV next month.

## 2010-12-16 ENCOUNTER — Ambulatory Visit: Payer: No Typology Code available for payment source | Admitting: Medical

## 2010-12-17 ENCOUNTER — Ambulatory Visit: Payer: No Typology Code available for payment source | Admitting: Medical

## 2010-12-18 ENCOUNTER — Ambulatory Visit (INDEPENDENT_AMBULATORY_CARE_PROVIDER_SITE_OTHER): Payer: Self-pay | Admitting: Medical

## 2010-12-18 ENCOUNTER — Encounter: Payer: Self-pay | Admitting: Medical

## 2010-12-18 VITALS — BP 112/80 | HR 84 | Temp 98.5°F | Resp 16 | Wt 166.5 lb

## 2010-12-18 DIAGNOSIS — I82509 Chronic embolism and thrombosis of unspecified deep veins of unspecified lower extremity: Secondary | ICD-10-CM | POA: Insufficient documentation

## 2010-12-18 NOTE — Patient Instructions (Signed)
Deep Vein Thrombosis A deep vein thrombosis (DVT) is a blood clot (thrombus) that develops in a deep vein. A DVT is a clot in the deep, larger veins of the leg, arm, or pelvis. These are more dangerous than clots that might form in veins on the surface of the body. Deep vein thrombosis can lead to complications if the clot breaks off and travels in the bloodstream to the lungs. CAUSES Blood clots form in a vein for different reasons. Usually several things cause blood clots. They include:  The flow of blood slows down.   The inside of the vein is damaged in some way.   The person has a condition that makes blood clot more easily. These conditions may include:   Older age (especially over 75 years old).   Having a history of blood clots.   Having major or lengthy surgery. Hip surgery is particularly high-risk.   Breaking a hip or leg.   Sitting or lying still for a long time.   Cancer or cancer treatment.   Having a long, thin tube (catheter) placed inside a vein during a medical procedure.   Being overweight (obese).   Pregnancy and childbirth.   Medicines with estrogen.   Smoking.   Other circulation or heart problems.  SYMPTOMS When a clot forms, it can either partially or totally block the blood flow in that vein. Symptoms of a DVT can include:  Swelling of the leg or arm, especially if one side is much worse.   Warmth and redness of the leg or arm, especially if one side is much worse.   Pain in an arm or leg. If the clot is in the leg, symptoms may be more noticeable or worse when standing or walking.  If the blood clot travels to the lung, it may cause:  Shortness of breath.   Chest pain. The pain may be worsened by deep breaths.   Coughing up thick mucus (phlegm), possibly flecked with blood.  Anyone with these symptoms should get emergency medical treatment right away. Call your local emergency services (911 in [place] if you have these symptoms. DIAGNOSIS If  a DVT is suspected, your caregiver will take a full medical history. He or she will also perform a physical exam. Tests that also may be required include:  Studies of the clotting properties of the blood.   An ultrasound scan.   X-rays to show the flow of blood when special dye is injected into the veins (venography).   Studies of your lungs if you have any chest symptoms.  PREVENTION  Exercise the legs regularly. Take a brisk 30 minute walk every day.   Maintain a weight that is appropriate for your height.   Avoid sitting or lying in bed for long periods of time without moving your legs.   Women, particularly those over the age of 35, should consider the risks and benefits of taking estrogen medicines, including birth control pills.   Do not smoke, especially if you take estrogen medicines.   Long-distance travel can increase your risk. You should exercise your legs by walking or pumping the muscles every hour.   In hospital prevention:   Prevention may include medical and nonmedical measures.  TREATMENT  The most common treatment for DVT is blood thinning (anticoagulant) medicine, which reduces the blood's tendency to clot. Anticoagulants can stop new blood clots from forming and old ones from growing. They cannot dissolve existing clots. Your body does this by itself over time.   Anticoagulants can be given by mouth, by intravenous (IV) access, or by injection. Your caregiver will determine the best program for you.   Less commonly, clot-dissolving drugs (thrombolytics) are used to dissolve a DVT. They carry a high risk of bleeding, so they are used mainly in severe cases.   Very rarely, a blood clot in the leg needs to be removed surgically.   If you are unable to take anticoagulants, your caregiver may arrange for you to have a filter placed in a main vein in your belly (abdomen). This filter prevents clots from traveling to your lungs.  HOME CARE INSTRUCTIONS  Take all  medicines prescribed by your caregiver. Follow the directions carefully.   You will most likely continue taking anticoagulants after you leave the hospital. Your caregiver will advise you on the length of treatment (usually 3 to 6 months, sometimes for life).   Taking too much or too little of an anticoagulant is dangerous. While taking this type of medicine, you will need to have regular blood tests to be sure the dose is correct. The dose can change for many reasons. It is critically important that you take this medicine exactly as prescribed, and that you have blood tests exactly as directed.   Many foods can interfere with anticoagulants. These include foods high in vitamin K, such as spinach, kale, broccoli, cabbage, collard and turnip greens, Brussels sprouts, peas, cauliflower, seaweed, parsley, beef and pork liver, green tea, and soybean oil. Your caregiver should discuss limits on these foods with you or you should arrange a visit with a dietician to answer your questions.   Many medicines can interfere with anticoagulants. You must tell your caregiver about any and all medicines you take. This includes all vitamins and supplements. Be especially cautious with aspirin and anti-inflammatory medicines. Ask your caregiver before taking these.   Anticoagulants can have side effects, mostly excessive bruising or bleeding. You will need to hold pressure over cuts for longer than usual. Avoid alcoholic drinks or consume only very small amounts while taking this medicine.   If you are taking an anticoagulant:   Wear a medical alert bracelet.   Notify your dentist or other caregivers before procedures.   Avoid contact sports.   Ask your caregiver how soon you can go back to normal activities. Not being active can lead to new clots. Ask for a list of what you should and should not do.   Exercise your lower leg muscles. This is important while traveling.   You may need to wear compression  stockings. These are tight elastic stockings that apply pressure to the lower legs. This can help keep the blood in the legs from clotting.   If you are a smoker, you should quit.   Learn as much as you can about DVT.  SEEK MEDICAL CARE IF:  You have unusual bruising or any bleeding problems.   The swelling or pain in your affected arm or leg is not gradually improving.   You anticipate surgery or long-distance travel. You should get specific advice on DVT prevention.   You discover other family members with blood clots. This may require further testing for inherited diseases or conditions.  SEEK IMMEDIATE MEDICAL CARE IF:  You develop chest pain.   You develop severe shortness of breath.   You begin to cough up bloody mucus or phlegm (sputum).   You feel dizzy or faint.   You develop swelling or pain in the leg.   You have   breathing problems after traveling.  MAKE SURE YOU:  Understand these instructions.   Will watch your condition.   Will get help right away if you are not doing well or get worse.  Document Released: 12/23/2004 Document Revised: 09/04/2010 Document Reviewed: 02/14/2010 [facility] Patient Information 2012 [place], [place].    Venous Thromboembolism, Prevention A venous thromboembolism is a blood clot that forms in a vein. A blood clot in a deep vein is called a deep venous thrombosis (DVT). A blood clot in the lungs is called a pulmonary embolism (PE). Blood clots are dangerous and can cause death. Blood clots can form in the:  Lungs.   Legs.   Arms.  CAUSES Blood clots can form in a vein from not moving (immobility) or poor blood circulation. When blood is not circulated well and blood "pools" in a vein, clotting factors in the blood can form a blood clot. Conditions that may increase your risk of a blood clot are:  Recent orthopedic or general surgery such as hip, knee or belly surgery.   Sit or lie still for over one hour during travel or are  confined to a bed or chair most of the time. (for example, during long distance travel, paralysis, or recovery from an illness or an operation).   Have had a stroke, heart attack, heart failure or are paralyzed.   Have a broken a bone such as a leg, hip or pelvis.   Have had cancer or are being treated for it.   Have a history of blood clots or have a family history of blood clots.   Take hormones, especially for birth control or hormone replacement therapy.   Are overweight (obese).   Are over 3 years of age or are female.   Smoke.   Have an implanted vascular access device.   Have blood circulation problems.  SYMPTOMS Symptoms of blood clot depend on where the clot is located:   Signs of a clot in a leg or arm vein may include:   Swelling of the leg or arm, especially on one side.   Warmth and redness of the leg or arm.   Pain in an arm or leg (worse when standing or walking).   Signs of a clot in the lungs can include:   Difficulty breathing or shortness of breath.   Chest pain. Pain is often worse with deep breaths.   Coughing.   Coughing up blood or blood tinged phlegm.   Rapid heartbeat.   Fainting.  A blood clot in the lungs (PE) is a medical emergency. Get immediate help if you have problems breathing.  PREVENTION  Exercise regularly. Take a brisk 30 minute walk every day. Staying active and moving around can help prevent blood clots.   Avoid sitting or lying in bed for long periods of time. Change your position often, especially during a long trip.   Women, especially those over the age of 1, should consider the risks and benefits of taking estrogen medications. This includes birth control pills and hormone replacement therapy.   Do not smoke, especially if you take estrogen medications. If you smoke, talk to your caregiver on how to quit.   Eat plenty of fruits and vegetables. Ask your caregiver or dietician if there are foods you should avoid.    Maintain a weight as suggested by your caregiver.   Wear loose-fitting clothing. Avoid constrictive or tight clothing around your legs or waist.   Try not to bump or injure your  legs. Avoid crossing your legs when you are sitting.   Do not use pillows under your knees unless told by your caregiver.   Take all medicines that your caregiver prescribes you.   Wear special stockings (compression stockings or TED hose) if your caregiver prescribes them.   Wearing compression stockings (support hose) can make the leg veins more narrow. This increases blood flow in the legs and can help prevent blood clots.   It is important to wear compression stockings correctly. Do not let them bunch up when you are wearing them.  TRAVEL Long distance travel can increase the risk of a blood clot. To prevent a blood clot when traveling:  You should exercise your legs by walking or by pumping your muscles every hour. To help prevent poor circulation on long trips, stand, stretch, and walk up and down the aisle of your airplane, train or bus as often as possible to get the blood moving.   Do squats if you are able. If you are unable to do squats, raise your foot on the balls of your feet and tighten your lower leg muscles (particularly the calve muscles) while seated. Pointing (flexing and extending) your toes while tightening your calves while seated are also good exercises to do every hour during long trips. They help increase blood flow and reduce risk of DVT.   Stay well hydrated. Drink water regularly when traveling, especially when you are sitting or immobile for long periods of time.   Use of drugs to prevent DVT during routine travel is not generally recommended. Before taking any drugs to reduce risk of DVT, consult your caregiver.  SURGERY AND HOSPITALIZATION  People who are at high risk for a blood clot may be given a blood thinning medication when they are hospitalized even if they are not going  to have surgery.   A long trip prior to surgery can increase the risk of a clot for patients undergoing hip and knee replacements. Talk to your caregiver about travel plans before your surgery.   After hip or knee surgery, your caregiver may give you blood thinners to help prevent blood clots.   Blood thinning medications (anticoagulant's) may be given to people at high risk of developing thromboembolism, before, during or sometimes after surgery, including people with clotting disorders or with a history of past thromboembolism.  TRAVEL AFTER SURGERY  In orthopedic surgery, the cutting of bones prompts the body to increase clotting factors in the blood. Due to the size of the bones involved in hip and knee replacements, there is a higher risk of blood clotting than other orthopedic surgeries.   There is a risk of clotting for up to 4-6 weeks after surgery. Flying or traveling long distances can increase your risk of a clot. As a result, those who travel long distances may need additional preventive measures after their procedure.   Drink only non-alcoholic beverages during your flight, train or car travel. Alcohol can dehydrate you and increase your risk of getting blood clots.  SEEK IMMEDIATE MEDICAL CARE IF:   You develop chest pain.   You develop severe shortness of breath.   You have breathing problems after traveling.   You develop swelling or pain in the leg.   You begin to cough up bloody mucus or phlegm (sputum).   You feel dizzy or faint.  Document Released: 12/11/2008 Document Revised: 09/04/2010 Document Reviewed: 12/11/2008 [facility] Patient Information 2012 [place], [place].
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
## 2010-12-18 NOTE — Progress Notes (Signed)
Addended by: Janeice Robinson on: 12/18/2010 10:18 AM   Modules accepted: Orders

## 2010-12-23 ENCOUNTER — Ambulatory Visit
Admission: RE | Admit: 2010-12-23 | Discharge: 2010-12-23 | Disposition: A | Discharge status: Home / Self Care | Payer: No Typology Code available for payment source | Source: Ambulatory Visit | Attending: Medical | Admitting: Medical

## 2010-12-23 DIAGNOSIS — I82509 Chronic embolism and thrombosis of unspecified deep veins of unspecified lower extremity: Secondary | ICD-10-CM

## 2011-03-05 ENCOUNTER — Telehealth: Payer: Self-pay | Admitting: Medical

## 2011-03-06 ENCOUNTER — Encounter: Payer: Self-pay | Admitting: Medical

## 2011-03-06 NOTE — Telephone Encounter (Signed)
LETTER TYPED FOR PT'S JOB-LM

## 2011-03-06 NOTE — Telephone Encounter (Signed)
pls write note that she no longer needs routine lab monitor for the medication she was on.  Her recent illness has resolved and she can return to work.  I didn't get a chance to write the note yesterday.

## 2011-04-28 ENCOUNTER — Ambulatory Visit: Payer: Self-pay | Admitting: Family Medicine

## 2011-07-22 ENCOUNTER — Encounter: Payer: Self-pay | Admitting: Medical

## 2011-07-22 ENCOUNTER — Telehealth: Payer: Self-pay | Admitting: Medical

## 2011-07-22 ENCOUNTER — Encounter: Payer: Self-pay | Admitting: Family Medicine

## 2011-07-22 NOTE — Telephone Encounter (Signed)
I fax over the letter that Vincenza Hews prepared to Leilani Able @ [phone] per the patients request. CLS

## 2011-07-22 NOTE — Telephone Encounter (Signed)
pls fax my letter.

## 2011-10-19 ENCOUNTER — Inpatient Hospital Stay (HOSPITAL_COMMUNITY)
Admission: AD | Admit: 2011-10-19 | Discharge: 2011-10-19 | Disposition: A | Discharge status: Home / Self Care | Payer: Self-pay | Source: Ambulatory Visit | Attending: Obstetrics and Gynecology | Admitting: Obstetrics and Gynecology

## 2011-10-19 ENCOUNTER — Encounter (HOSPITAL_COMMUNITY): Payer: Self-pay | Admitting: Obstetrics and Gynecology

## 2011-10-19 DIAGNOSIS — N938 Other specified abnormal uterine and vaginal bleeding: Secondary | ICD-10-CM | POA: Insufficient documentation

## 2011-10-19 DIAGNOSIS — N898 Other specified noninflammatory disorders of vagina: Secondary | ICD-10-CM

## 2011-10-19 DIAGNOSIS — R109 Unspecified abdominal pain: Secondary | ICD-10-CM | POA: Insufficient documentation

## 2011-10-19 DIAGNOSIS — N949 Unspecified condition associated with female genital organs and menstrual cycle: Secondary | ICD-10-CM | POA: Insufficient documentation

## 2011-10-19 DIAGNOSIS — N920 Excessive and frequent menstruation with regular cycle: Secondary | ICD-10-CM

## 2011-10-19 LAB — URINE MICROSCOPIC-ADD ON

## 2011-10-19 LAB — URINALYSIS, ROUTINE W REFLEX MICROSCOPIC
Leukocytes, UA: NEGATIVE
Protein, ur: NEGATIVE mg/dL
Urobilinogen, UA: 0.2 mg/dL (ref 0.0–1.0)

## 2011-10-19 LAB — POCT PREGNANCY, URINE: Preg Test, Ur: NEGATIVE

## 2011-10-19 NOTE — MAU Note (Signed)
"  I have had abdominal pain that started this morning.  I first noticed pink d/c with wiping last Tuesday.  The d/c would come and go and not be constant.  I had a lot of the pink d/c this morning when I got up at 0800.  I laid down for about an hour and then I didn't have anymore d/c.  My doctor can't see me for a while, so I just wanted to come here to be checked out.  I am due to get a Depo shot on the 17th, so I'm not sure if that has something to do with this or what.  I am not interested in getting my shot again, because my husband and I are back together and will be okay if we get pregnant."

## 2011-10-19 NOTE — MAU Provider Note (Signed)
History     CSN: [id number]  Arrival date and time: 10/19/11 0858   None     Chief Complaint  Patient presents with  . Abdominal Pain  . Vaginal Bleeding   HPI 36 y.o. [id number] with intermittent low abd pain x 1 week, right sided, quick/sharp pain. Spotting since Tuesday, brownish/pinkish. Depo Provera in July. Not planning on continuing with Depo, considering pregnancy. Treated for Trich 2-3 weeks ago.    Past Medical History  Diagnosis Date  . DVT (deep venous thrombosis) 07/2010    right leg  . Headache disorder   . Osteoporosis     LEFT HIP  . Contraceptive management     No past surgical history on file.  No family history on file.  History  Substance Use Topics  . Smoking status: Never Smoker   . Smokeless tobacco: Never Used  . Alcohol Use: No    Allergies:  Allergies  Allergen Reactions  . Latex Itching    Prescriptions prior to admission  Medication Sig Dispense Refill  . medroxyPROGESTERone (DEPO-PROVERA) 150 MG/ML injection Inject 150 mg into the muscle every 3 (three) months.        Review of Systems  Constitutional: Negative.   Respiratory: Negative.   Cardiovascular: Negative.   Gastrointestinal: Positive for abdominal pain. Negative for nausea, vomiting, diarrhea and constipation.  Genitourinary: Negative for dysuria, urgency, frequency, hematuria and flank pain.       Positive for spotting  Musculoskeletal: Negative.   Neurological: Negative.   Psychiatric/Behavioral: Negative.    Physical Exam   Height 5\' 4"  (1.626 m), weight 167 lb 9.6 oz (76.023 kg).  Physical Exam  Constitutional: She is oriented to person, place, and time. She appears well-developed and well-nourished. No distress.  HENT:  Head: Normocephalic and atraumatic.  Cardiovascular: Normal rate and regular rhythm.   Respiratory: Effort normal. No respiratory distress.  GI: Soft. She exhibits no distension and no mass. There is no tenderness. There is no rebound and  no guarding.  Genitourinary: There is no rash or lesion on the right labia. There is no rash or lesion on the left labia. Uterus is not deviated, not enlarged, not fixed and not tender. Cervix exhibits no motion tenderness, no discharge and no friability. Right adnexum displays no mass, no tenderness and no fullness. Left adnexum displays no mass, no tenderness and no fullness. There is bleeding (scant, brownish) around the vagina. No erythema or tenderness around the vagina. No vaginal discharge found.  Neurological: She is alert and oriented to person, place, and time.  Skin: Skin is warm and dry.  Psychiatric: She has a normal mood and affect.    MAU Course  Procedures  Results for orders placed during the hospital encounter of 10/19/11 (from the past 24 hour(s))  URINALYSIS, ROUTINE W REFLEX MICROSCOPIC     Status: Abnormal   Collection Time   10/19/11  9:05 AM      Component Value Range   Color, Urine YELLOW  YELLOW   APPearance CLEAR  CLEAR   Specific Gravity, Urine <1.005 (*) 1.005 - 1.030   pH 6.5  5.0 - 8.0   Glucose, UA NEGATIVE  NEGATIVE mg/dL   Hgb urine dipstick LARGE (*) NEGATIVE   Bilirubin Urine NEGATIVE  NEGATIVE   Ketones, ur NEGATIVE  NEGATIVE mg/dL   Protein, ur NEGATIVE  NEGATIVE mg/dL   Urobilinogen, UA 0.2  0.0 - 1.0 mg/dL   Nitrite NEGATIVE  NEGATIVE   Leukocytes, UA NEGATIVE  NEGATIVE  URINE MICROSCOPIC-ADD ON     Status: Normal   Collection Time   10/19/11  9:05 AM      Component Value Range   Squamous Epithelial / LPF RARE  RARE   RBC / HPF 3-6  <3 RBC/hpf   Bacteria, UA RARE  RARE  POCT PREGNANCY, URINE     Status: Normal   Collection Time   10/19/11  9:21 AM      Component Value Range   Preg Test, Ur NEGATIVE  NEGATIVE  WET PREP, GENITAL     Status: Abnormal   Collection Time   10/19/11  9:56 AM      Component Value Range   Yeast Wet Prep HPF POC NONE SEEN  NONE SEEN   Trich, Wet Prep NONE SEEN  NONE SEEN   Clue Cells Wet Prep HPF POC NONE  SEEN  NONE SEEN   WBC, Wet Prep HPF POC FEW (*) NONE SEEN     Assessment and Plan  Spotting - likely related to coming off of Depo Provera Rev'd normal return to menstrual cycle after stopping depo Follow up as needed  Jennise Both 10/19/2011, 9:23 AM

## 2011-10-19 NOTE — MAU Provider Note (Signed)
Attestation of Attending Supervision of Advanced Practitioner: Evaluation and management procedures were performed by the PA/NP/CNM/OB Fellow under my supervision/collaboration. Chart reviewed and agree with management and plan.  Keziah Drotar V 10/19/2011 9:14 PM

## 2012-01-07 DIAGNOSIS — Z86718 Personal history of other venous thrombosis and embolism: Secondary | ICD-10-CM | POA: Insufficient documentation

## 2012-03-23 ENCOUNTER — Other Ambulatory Visit (HOSPITAL_COMMUNITY): Payer: Self-pay | Admitting: Obstetrics and Gynecology

## 2012-03-23 DIAGNOSIS — N856 Intrauterine synechiae: Secondary | ICD-10-CM

## 2012-03-25 ENCOUNTER — Ambulatory Visit (HOSPITAL_COMMUNITY)
Admission: RE | Admit: 2012-03-25 | Discharge: 2012-03-25 | Disposition: A | Discharge status: Home / Self Care | Payer: PRIVATE HEALTH INSURANCE | Source: Ambulatory Visit | Attending: Obstetrics and Gynecology | Admitting: Obstetrics and Gynecology

## 2012-03-25 DIAGNOSIS — N979 Female infertility, unspecified: Secondary | ICD-10-CM | POA: Insufficient documentation

## 2012-03-25 DIAGNOSIS — N856 Intrauterine synechiae: Secondary | ICD-10-CM | POA: Insufficient documentation

## 2012-03-25 LAB — PREGNANCY, URINE: Preg Test, Ur: 1:20 {titer} — AB

## 2012-03-25 MED ORDER — IOHEXOL 300 MG/ML  SOLN
20.0000 mL | Freq: Once | INTRAMUSCULAR | Status: AC | PRN
  Administered 2012-03-25: 7 mL

## 2012-06-23 ENCOUNTER — Encounter (HOSPITAL_COMMUNITY): Payer: Self-pay

## 2012-06-23 ENCOUNTER — Inpatient Hospital Stay (HOSPITAL_COMMUNITY)
Admission: AD | Admit: 2012-06-23 | Discharge: 2012-06-23 | Disposition: A | Discharge status: Home / Self Care | Payer: PRIVATE HEALTH INSURANCE | Source: Ambulatory Visit | Attending: Obstetrics and Gynecology | Admitting: Obstetrics and Gynecology

## 2012-06-23 DIAGNOSIS — N940 Mittelschmerz: Secondary | ICD-10-CM

## 2012-06-23 DIAGNOSIS — B9689 Other specified bacterial agents as the cause of diseases classified elsewhere: Secondary | ICD-10-CM | POA: Insufficient documentation

## 2012-06-23 DIAGNOSIS — A499 Bacterial infection, unspecified: Secondary | ICD-10-CM

## 2012-06-23 DIAGNOSIS — R1032 Left lower quadrant pain: Secondary | ICD-10-CM | POA: Insufficient documentation

## 2012-06-23 DIAGNOSIS — N760 Acute vaginitis: Secondary | ICD-10-CM

## 2012-06-23 LAB — URINALYSIS, ROUTINE W REFLEX MICROSCOPIC
Ketones, ur: NEGATIVE mg/dL
Leukocytes, UA: NEGATIVE
Nitrite: NEGATIVE
Protein, ur: NEGATIVE mg/dL
Urobilinogen, UA: 0.2 mg/dL (ref 0.0–1.0)

## 2012-06-23 LAB — WET PREP, GENITAL
Trich, Wet Prep: NONE SEEN
Yeast Wet Prep HPF POC: NONE SEEN

## 2012-06-23 LAB — POCT PREGNANCY, URINE: Preg Test, Ur: NEGATIVE

## 2012-06-23 LAB — URINE MICROSCOPIC-ADD ON

## 2012-06-23 MED ORDER — METRONIDAZOLE 500 MG PO TABS: 500.0000 mg | ORAL_TABLET | Freq: Two times a day (BID) | ORAL | Status: DC

## 2012-06-23 NOTE — MAU Note (Signed)
Patient is in with c/o constant intense llq cramping that started yesterday. She states that the pain was severe this morning, patient have not taken any OTC medication for pain. She denies vaginal bleeding or abnormal discharge. Her lmp 06/08/12

## 2012-06-23 NOTE — MAU Provider Note (Signed)
  History     CSN: [id number]  Arrival date and time: 06/23/12 4098   First Provider Initiated Contact with Patient 06/23/12 1005      Chief Complaint  Patient presents with  . Abdominal Pain   HPI 37 y.o. [id number] with LLQ pain starting yesterday. No n/v, diarrhea, constipation, fever, chills, vaginal bleeding or discharge. Does report some vaginal irritation starting yesterday, Intercourse yesterday, symptoms started prior to IC. Patient's last menstrual period was 06/08/2012.   Past Medical History  Diagnosis Date  . DVT (deep venous thrombosis) 07/2010    right leg  . Headache disorder   . Osteoporosis     LEFT HIP  . Contraceptive management     Past Surgical History  Procedure Laterality Date  . Dilation and curettage of uterus      TAB    History reviewed. No pertinent family history.  History  Substance Use Topics  . Smoking status: Never Smoker   . Smokeless tobacco: Never Used  . Alcohol Use: No    Allergies:  Allergies  Allergen Reactions  . Latex Itching    No prescriptions prior to admission    ROS Physical Exam   Blood pressure 126/71, pulse 74, temperature 98.4 F (36.9 C), temperature source Oral, resp. rate 16, height 5\' 4"  (1.626 m), weight 168 lb 4 oz (76.318 kg), last menstrual period 06/08/2012.  Physical Exam  Nursing note and vitals reviewed. Constitutional: She is oriented to person, place, and time. She appears well-developed and well-nourished. No distress.  HENT:  Head: Normocephalic and atraumatic.  Cardiovascular: Normal rate and regular rhythm.   Respiratory: Effort normal. No respiratory distress.  GI: Soft. She exhibits no distension and no mass. There is no tenderness. There is no rebound and no guarding.  Genitourinary: There is no rash or lesion on the right labia. There is no rash or lesion on the left labia. Uterus is not deviated, not enlarged, not fixed and not tender. Cervix exhibits discharge ("egg white" cervical  mucous noted). Cervix exhibits no motion tenderness and no friability. Right adnexum displays no mass, no tenderness and no fullness. Left adnexum displays no mass, no tenderness and no fullness. No erythema, tenderness or bleeding around the vagina. No vaginal discharge found.  Neurological: She is alert and oriented to person, place, and time.  Skin: Skin is warm and dry.  Psychiatric: She has a normal mood and affect.    MAU Course  Procedures Wet prep: few clue cells UA: small Hgb UPT: negative  Assessment and Plan   1. BV (bacterial vaginosis)   2. Mittelschmerz   Considering timing of pain in menstrual cycle and appearance of cervical mucous, I think it's likely that this is ovulatory pain. Pt agrees. Will f/u outpatient if pain becomes severe or persists past her next period. Pregnancy test at home if her next period doesn't come as expected.     Medication List    TAKE these medications       metroNIDAZOLE 500 MG tablet  Commonly known as:  FLAGYL  Take 1 tablet (500 mg total) by mouth 2 (two) times daily.            Follow-up Information   Follow up with [facility]. (As needed)    Contact information:   [address] [place] [address] [phone]        Hayle Parisi 06/23/2012, 10:06 AM

## 2012-06-24 LAB — GC/CHLAMYDIA PROBE AMP: GC Probe RNA: NEGATIVE

## 2012-06-28 NOTE — MAU Provider Note (Signed)
Attestation of Attending Supervision of Advanced Practitioner (CNM/NP): Evaluation and management procedures were performed by the Advanced Practitioner under my supervision and collaboration.  I have reviewed the Advanced Practitioner's note and chart, and I agree with the management and plan.  Lamichael Youkhana 06/28/2012 11:51 AM   

## 2012-07-04 ENCOUNTER — Emergency Department (HOSPITAL_COMMUNITY): Payer: Self-pay

## 2012-07-04 ENCOUNTER — Emergency Department (HOSPITAL_COMMUNITY)
Admission: EM | Admit: 2012-07-04 | Discharge: 2012-07-04 | Disposition: A | Discharge status: Home / Self Care | Payer: Self-pay | Attending: Emergency Medicine | Admitting: Emergency Medicine

## 2012-07-04 ENCOUNTER — Encounter (HOSPITAL_COMMUNITY): Payer: Self-pay | Admitting: Emergency Medicine

## 2012-07-04 DIAGNOSIS — R791 Abnormal coagulation profile: Secondary | ICD-10-CM | POA: Insufficient documentation

## 2012-07-04 DIAGNOSIS — R0602 Shortness of breath: Secondary | ICD-10-CM | POA: Insufficient documentation

## 2012-07-04 DIAGNOSIS — M549 Dorsalgia, unspecified: Secondary | ICD-10-CM | POA: Insufficient documentation

## 2012-07-04 DIAGNOSIS — Z91040 Latex allergy status: Secondary | ICD-10-CM | POA: Insufficient documentation

## 2012-07-04 DIAGNOSIS — Z8679 Personal history of other diseases of the circulatory system: Secondary | ICD-10-CM | POA: Insufficient documentation

## 2012-07-04 DIAGNOSIS — Z86718 Personal history of other venous thrombosis and embolism: Secondary | ICD-10-CM | POA: Insufficient documentation

## 2012-07-04 DIAGNOSIS — J189 Pneumonia, unspecified organism: Secondary | ICD-10-CM

## 2012-07-04 DIAGNOSIS — M810 Age-related osteoporosis without current pathological fracture: Secondary | ICD-10-CM | POA: Insufficient documentation

## 2012-07-04 LAB — URINALYSIS, ROUTINE W REFLEX MICROSCOPIC
Glucose, UA: NEGATIVE mg/dL
Specific Gravity, Urine: 1.016 (ref 1.005–1.030)
Urobilinogen, UA: 1 mg/dL (ref 0.0–1.0)

## 2012-07-04 LAB — URINE MICROSCOPIC-ADD ON

## 2012-07-04 LAB — COMPREHENSIVE METABOLIC PANEL
AST: 14 U/L (ref 0–37)
CO2: 27 mEq/L (ref 19–32)
Calcium: 9.1 mg/dL (ref 8.4–10.5)
Creatinine, Ser: 0.79 mg/dL (ref 0.50–1.10)
GFR calc Af Amer: >90 mL/min (ref >90)
GFR calc non Af Amer: >90 mL/min (ref >90)
Glucose, Bld: 87 mg/dL (ref 70–99)
Sodium: 137 mEq/L (ref 135–145)
Total Protein: 6.7 g/dL (ref 6.0–8.3)

## 2012-07-04 LAB — LIPASE, BLOOD: Lipase: 40 U/L (ref 11–59)

## 2012-07-04 LAB — CBC
MCH: 29.5 pg (ref 26.0–34.0)
MCHC: 34 g/dL (ref 30.0–36.0)
MCV: 86.9 fL (ref 78.0–100.0)
Platelets: 243 10*3/uL (ref 150–400)
RBC: 4.13 MIL/uL (ref 3.87–5.11)

## 2012-07-04 LAB — POCT I-STAT TROPONIN I: Troponin i, poc: 0 ng/mL (ref 0.00–0.08)

## 2012-07-04 MED ORDER — AZITHROMYCIN 250 MG PO TABS: 250.0000 mg | ORAL_TABLET | Freq: Every day | ORAL | Status: DC

## 2012-07-04 MED ORDER — ACETAMINOPHEN-CODEINE #3 300-30 MG PO TABS: 1.0000 | ORAL_TABLET | Freq: Four times a day (QID) | ORAL | Status: DC | PRN

## 2012-07-04 MED ORDER — AZITHROMYCIN 250 MG PO TABS
500.0000 mg | ORAL_TABLET | Freq: Once | ORAL | Status: AC
  Administered 2012-07-04: 500 mg via ORAL
  Filled 2012-07-04: qty 2

## 2012-07-04 NOTE — ED Notes (Addendum)
Pt to ED by EMS with c/o sharp/burn, center chest pain, onset this evening. Pt states she was taking a nap and woke up with nausea and intermittent chest pain. Pt taken 325 ASA prior to arrival to ED. Per EMS EKG unremarkable, BP-131/79.

## 2012-07-04 NOTE — Discharge Instructions (Signed)

## 2012-07-04 NOTE — ED Notes (Signed)
New EKG given to Dr Oletta Lamas . No old EKG.

## 2012-07-04 NOTE — ED Provider Notes (Addendum)
History    CSN: [id number] Arrival date & time 07/04/12  2028  First MD Initiated Contact with Patient 07/04/12 2036     Chief Complaint  Patient presents with  . Chest Pain   (Consider location/radiation/quality/duration/timing/severity/associated sxs/prior Treatment) Patient is a 37 y.o. female presenting with chest pain. The history is provided by the patient and the EMS personnel.  Chest Pain Pain location:  Substernal area Pain quality: aching, sharp, stabbing and throbbing   Pain radiates to:  Does not radiate Pain radiates to the back: no   Pain severity:  Severe Onset quality:  Gradual Duration:  45 minutes Timing:  Intermittent Progression:  Resolved Chronicity:  New Context: at rest   Context: not breathing, not eating, no movement, no stress and no trauma   Relieved by:  Nothing Worsened by:  Nothing tried Ineffective treatments:  None tried Associated symptoms: back pain and shortness of breath   Associated symptoms: no abdominal pain, no cough, no diaphoresis, no dizziness, no fever, no nausea, no near-syncope, no syncope, not vomiting and no weakness   Shortness of breath:    Severity:  Mild   Onset quality:  Gradual   Timing:  Constant   Progression:  Resolved Risk factors: prior DVT/PE   Risk factors: no aortic disease, no birth control, no coronary artery disease, no diabetes mellitus, no hypertension, no immobilization, not female, no smoking and no surgery    Past Medical History  Diagnosis Date  . DVT (deep venous thrombosis) 07/2010    right leg  . Headache disorder   . Osteoporosis     LEFT HIP  . Contraceptive management    Past Surgical History  Procedure Laterality Date  . Dilation and curettage of uterus      TAB  . Chest pain     History reviewed. No pertinent family history. History  Substance Use Topics  . Smoking status: Never Smoker   . Smokeless tobacco: Never Used  . Alcohol Use: No   OB History   Grav Para Term Preterm  Abortions TAB SAB Ect Mult Living   3 2 2  1 1    2      Review of Systems  Constitutional: Negative for fever, chills and diaphoresis.  Respiratory: Positive for shortness of breath. Negative for cough.   Cardiovascular: Positive for chest pain. Negative for leg swelling, syncope and near-syncope.  Gastrointestinal: Negative for nausea, vomiting and abdominal pain.  Musculoskeletal: Positive for back pain.  Neurological: Negative for dizziness, weakness and light-headedness.  All other systems reviewed and are negative.    Allergies  Latex  Home Medications   Current Outpatient Rx  Name  Route  Sig  Dispense  Refill  . acetaminophen-codeine (TYLENOL #3) 300-30 MG per tablet   Oral   Take 1-2 tablets by mouth every 6 (six) hours as needed for pain.   20 tablet   0   . azithromycin (ZITHROMAX) 250 MG tablet   Oral   Take 1 tablet (250 mg total) by mouth daily.   4 tablet   0    BP 119/74  Pulse 64  Temp(Src) 98.3 F (36.8 C) (Oral)  Resp 20  SpO2 98%  LMP 06/08/2012 Physical Exam  Nursing note and vitals reviewed. Constitutional: She is oriented to person, place, and time. She appears well-developed and well-nourished. No distress.  HENT:  Head: Normocephalic and atraumatic.  Eyes: Conjunctivae and EOM are normal. No scleral icterus.  Neck: Neck supple.  Cardiovascular: Normal  rate, regular rhythm and intact distal pulses.   No murmur heard. Pulmonary/Chest: Effort normal. No respiratory distress. She has no wheezes. She has no rales.  Abdominal: Soft. She exhibits no distension. There is no tenderness. There is no rebound and no guarding.  Musculoskeletal: She exhibits no edema.  Neurological: She is alert and oriented to person, place, and time. She exhibits normal muscle tone. Coordination normal.  Skin: Skin is warm and dry. No rash noted. She is not diaphoretic. No pallor.  Psychiatric: She has a normal mood and affect.    ED Course  Procedures  (including critical care time) Labs Reviewed  CBC - Abnormal; Notable for the following:    HCT 35.9 (*)    All other components within normal limits  COMPREHENSIVE METABOLIC PANEL - Abnormal; Notable for the following:    Potassium 3.4 (*)    Albumin 3.3 (*)    All other components within normal limits  D-DIMER, QUANTITATIVE - Abnormal; Notable for the following:    D-Dimer, Quant 0.60 (*)    All other components within normal limits  URINALYSIS, ROUTINE W REFLEX MICROSCOPIC - Abnormal; Notable for the following:    APPearance CLOUDY (*)    Hgb urine dipstick TRACE (*)    Leukocytes, UA MODERATE (*)    All other components within normal limits  URINE MICROSCOPIC-ADD ON - Abnormal; Notable for the following:    Squamous Epithelial / LPF FEW (*)    All other components within normal limits  URINE CULTURE  LIPASE, BLOOD  CBC WITH DIFFERENTIAL  URINALYSIS, ROUTINE W REFLEX MICROSCOPIC  POCT I-STAT TROPONIN I   Dg Chest 2 View  07/04/2012   *RADIOLOGY REPORT*  Clinical Data: Chest pain.  CHEST - 2 VIEW  Comparison: 07/15/2010.  Findings: There is new patchy right lower lobe airspace opacity seen on the frontal and lateral views.  In the right infrahilar region, likely within the superior segment of the right lower lobe. Lateral view, this projects over the lower thoracic spine. Findings suspicious for pneumonia. The left lung is clear. Cardiopericardial silhouette is within normal limits. No pneumothorax. Monitoring leads are projected over the chest. Follow- up to ensure radiographic clearing recommended.  Clearing is usually observed at 8 weeks.  IMPRESSION: Patchy right lower lobe airspace density suspicious for pneumonia.   Original Report Authenticated By: Andreas Newport, M.D.   1. Community acquired pneumonia      ECG at time 20:39 shows NSR at rate 66, normal axis, no ST or T wave abns, RSR` in leads V1 and V2, likely normal, no ST or T wave abns.    Saturation on [place] O2 is 100%  and I interpret to be normal   11:01 PM CXR is interpreted by radiology as right patchy density suspicious for pneumonia.  Will start abx and likely explains her onset of pain.    11:20 PM Of note dimer is slightly elevated at 0.60, but is explainable by presence of pneumonia in my opinion.  Certainly is not significanlt elevated to suggest a significant clot burden such as a PE. MDM  Pt with no recent risk factors for CAD, occurred when sleeping, no diaphoresis, N/V.  Some SOB.  Pt has h/o DVT, but no risk factors for PE, sats 100%, not tachycardic, no pleuritic pain.  Pain is very point tender lower sternal region.  No crepitus on exam.  ECG shows no ischemia.  Will check LFTs, lipase, CXR and ddimer given h/o thrombotic disease.  However, very  low suspicion for ACS or PE.    Gavin Pound. Oletta Lamas, MD 07/04/12 1610  Gavin Pound. Melissa Tomaselli, MD 07/04/12 2320

## 2012-07-04 NOTE — ED Notes (Signed)
Ghim MD at bedside. 

## 2012-07-04 NOTE — ED Notes (Signed)
Pt states understanding of discharge instructions 

## 2012-07-06 LAB — URINE CULTURE: Colony Count: 10000

## 2012-07-29 ENCOUNTER — Emergency Department (INDEPENDENT_AMBULATORY_CARE_PROVIDER_SITE_OTHER)
Admission: EM | Admit: 2012-07-29 | Discharge: 2012-07-29 | Disposition: A | Discharge status: Home / Self Care | Payer: Self-pay | Source: Home / Self Care | Attending: Family Medicine | Admitting: Family Medicine

## 2012-07-29 ENCOUNTER — Emergency Department (INDEPENDENT_AMBULATORY_CARE_PROVIDER_SITE_OTHER): Payer: Self-pay

## 2012-07-29 ENCOUNTER — Encounter (HOSPITAL_COMMUNITY): Payer: Self-pay | Admitting: Emergency Medicine

## 2012-07-29 DIAGNOSIS — R42 Dizziness and giddiness: Secondary | ICD-10-CM

## 2012-07-29 DIAGNOSIS — M542 Cervicalgia: Secondary | ICD-10-CM

## 2012-07-29 MED ORDER — CYCLOBENZAPRINE HCL 10 MG PO TABS: 10.0000 mg | ORAL_TABLET | Freq: Two times a day (BID) | ORAL | Status: DC | PRN

## 2012-07-29 MED ORDER — DICLOFENAC SODIUM 75 MG PO TBEC: 75.0000 mg | DELAYED_RELEASE_TABLET | Freq: Two times a day (BID) | ORAL | Status: DC
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
## 2012-07-29 NOTE — ED Provider Notes (Signed)
Medical screening examination/treatment/procedure(s) were performed by resident physician or non-physician practitioner and as supervising physician I was immediately available for consultation/collaboration.   Barkley Bruns MD.   Linna Hoff, MD 07/29/12 2005

## 2012-11-12 ENCOUNTER — Inpatient Hospital Stay (HOSPITAL_COMMUNITY): Payer: BC Managed Care – PPO

## 2012-11-12 ENCOUNTER — Encounter (HOSPITAL_COMMUNITY): Payer: Self-pay | Admitting: *Deleted

## 2012-11-12 ENCOUNTER — Inpatient Hospital Stay (HOSPITAL_COMMUNITY)
Admission: AD | Admit: 2012-11-12 | Discharge: 2012-11-12 | Disposition: A | Discharge status: Home / Self Care | Payer: BC Managed Care – PPO | Source: Ambulatory Visit | Attending: Family Medicine | Admitting: Family Medicine

## 2012-11-12 DIAGNOSIS — R1032 Left lower quadrant pain: Secondary | ICD-10-CM | POA: Insufficient documentation

## 2012-11-12 DIAGNOSIS — Z3201 Encounter for pregnancy test, result positive: Secondary | ICD-10-CM

## 2012-11-12 DIAGNOSIS — O99891 Other specified diseases and conditions complicating pregnancy: Secondary | ICD-10-CM | POA: Insufficient documentation

## 2012-11-12 DIAGNOSIS — O26899 Other specified pregnancy related conditions, unspecified trimester: Secondary | ICD-10-CM

## 2012-11-12 LAB — CBC
HCT: 36.1 % (ref 36.0–46.0)
Platelets: 239 10*3/uL (ref 150–400)
RDW: 12.7 % (ref 11.5–15.5)
WBC: 6.7 10*3/uL (ref 4.0–10.5)

## 2012-11-12 LAB — URINE MICROSCOPIC-ADD ON

## 2012-11-12 LAB — URINALYSIS, ROUTINE W REFLEX MICROSCOPIC
Leukocytes, UA: NEGATIVE
Nitrite: NEGATIVE
Specific Gravity, Urine: 1.015 (ref 1.005–1.030)
Urobilinogen, UA: 0.2 mg/dL (ref 0.0–1.0)

## 2012-11-12 LAB — WET PREP, GENITAL: Yeast Wet Prep HPF POC: NONE SEEN

## 2012-11-12 LAB — HCG, QUANTITATIVE, PREGNANCY: hCG, Beta Chain, Quant, S: 2668 m[IU]/mL — ABNORMAL HIGH (ref <5)

## 2012-11-12 LAB — POCT PREGNANCY, URINE: Preg Test, Ur: POSITIVE — AB

## 2012-11-12 LAB — ABO/RH: ABO/RH(D): O POS

## 2012-11-12 NOTE — MAU Note (Signed)
Crying and upset about pregnancy; waiting to go to u/s;

## 2012-11-12 NOTE — MAU Provider Note (Signed)
Chart reviewed and agree with management and plan.  

## 2012-11-12 NOTE — MAU Note (Signed)
Patient states she is late for her period, has been having pain all week but worse this am. Denies bleeding or discharge.

## 2012-11-12 NOTE — MAU Provider Note (Signed)
History     CSN: [id number]  Arrival date and time: 11/12/12 0841   First Provider Initiated Contact with Patient 11/12/12 [id number]      Chief Complaint  Patient presents with  . Abdominal Pain  . Possible Pregnancy   Abdominal Pain Pertinent negatives include no constipation, diarrhea, dysuria, fever, frequency, headaches, nausea or vomiting.  Possible Pregnancy Associated symptoms include abdominal pain. Pertinent negatives include no chest pain, chills, fever, headaches, nausea or vomiting.    [name] is a 37 yo G3P2012 who presents today c/o LLQ pain x 1 week.  Her LMP was 10/7, she is sexually active and is not using any form of contraception.  Her menses have been irregular since coming off of Depo about 1.5 years ago. She denies any personal hx of gynecological or other medical problems, other than a DVT in 2012; she is not on anticoagulants currently. She receives routine gynecological care at [facility], but has not been in about a year.  She states that she receives care annually and has never had an abnormal pap smear or a workup for any issues.  She does report seeing a fertility specialist this past spring as she and her husband, at the time, desired pregnancy. She had a hysterogram done on 3/18 which was normal. Both of her children were vaginal deliveries.  She has NKDA and is not taking any medications.  She denies any exposures to STDs. UPT (+) today.   [name] reports that for about a week, she has noticed LLQ pain that has been constant and dull.  It was tolerable until this morning, around 8 am, the pain became suddenly sharp with 7-8/10 pain. The pain is constant, it does not radiate, and it is not alleviated nor relieved by anything.  She has not taken anything for the pain or tried heat/ice.  The pain does not change with position or movement.  She denies any HA, CP, SOB, N/V, diarrhea, constipation, dysuria, burning with urination, hematuria, vaginal bleeding or  discharge. Her appetite is unchanged.    OB History   Grav Para Term Preterm Abortions TAB SAB Ect Mult Living   3 2 2  1 1    2       Past Medical History  Diagnosis Date  . DVT (deep venous thrombosis) 07/2010    right leg  . Headache disorder   . Osteoporosis     LEFT HIP  . Contraceptive management     Past Surgical History  Procedure Laterality Date  . Chest pain      Family History  Problem Relation Age of Onset  . Hypertension Father   . Hypertension Maternal Aunt   . Hypertension Paternal Uncle   . Hypertension Maternal Grandmother   . Hypertension Maternal Grandfather   . Hypertension Paternal Grandmother     History  Substance Use Topics  . Smoking status: Never Smoker   . Smokeless tobacco: Never Used  . Alcohol Use: No    Allergies:  Allergies  Allergen Reactions  . Latex Itching    No prescriptions prior to admission    Review of Systems  Constitutional: Negative for fever, chills and malaise/fatigue.  Eyes: Negative for blurred vision.  Respiratory: Negative for shortness of breath.   Cardiovascular: Negative for chest pain and leg swelling.  Gastrointestinal: Positive for abdominal pain. Negative for heartburn, nausea, vomiting, diarrhea and constipation.       LLQ pain x 1week  Genitourinary: Negative for dysuria and frequency.  Neurological: Negative for dizziness and headaches.   Physical Exam   Blood pressure 122/70, pulse 64, temperature 98.4 F (36.9 C), temperature source Oral, resp. rate 16, height 5\' 4"  (1.626 m), weight 74.844 kg (165 lb), last menstrual period 10/12/2012, SpO2 100.00%.  Physical Exam  Constitutional: She is oriented to person, place, and time. She appears well-developed and well-nourished. No distress.  HENT:  Head: Normocephalic and atraumatic.  Eyes: EOM are normal.  Cardiovascular: Normal rate, regular rhythm, normal heart sounds and intact distal pulses.  Exam reveals no gallop and no friction rub.    No murmur heard. Respiratory: Effort normal and breath sounds normal. She has no wheezes.  GI: Soft. Bowel sounds are normal. She exhibits no distension and no mass. There is no tenderness.  Genitourinary: Vaginal discharge found.  Pelvic exam: VULVA: normal appearing vulva with no masses, tenderness or lesions, clitoral piercing noted, VAGINA: vaginal discharge - white, creamy and odorless, WET MOUNT done - results: no clue cells, yeast, or trich; few WBCs, CERVIX: cervical discharge present - white and creamy, UTERUS: uterus is normal size, shape, consistency and nontender, ADNEXA: normal adnexa in size, nontender and no masses  Musculoskeletal: Normal range of motion.  Neurological: She is alert and oriented to person, place, and time.  Skin: Skin is warm.  Psychiatric: She has a normal mood and affect.   Results for orders placed during the hospital encounter of 11/12/12 (from the past 24 hour(s))  URINALYSIS, ROUTINE W REFLEX MICROSCOPIC     Status: Abnormal   Collection Time    11/12/12  9:05 AM      Result Value Range   Color, Urine YELLOW  YELLOW   APPearance CLEAR  CLEAR   Specific Gravity, Urine 1.015  1.005 - 1.030   pH 7.0  5.0 - 8.0   Glucose, UA NEGATIVE  NEGATIVE mg/dL   Hgb urine dipstick TRACE (*) NEGATIVE   Bilirubin Urine NEGATIVE  NEGATIVE   Ketones, ur NEGATIVE  NEGATIVE mg/dL   Protein, ur NEGATIVE  NEGATIVE mg/dL   Urobilinogen, UA 0.2  0.0 - 1.0 mg/dL   Nitrite NEGATIVE  NEGATIVE   Leukocytes, UA NEGATIVE  NEGATIVE  URINE MICROSCOPIC-ADD ON     Status: None   Collection Time    11/12/12  9:05 AM      Result Value Range   Squamous Epithelial / LPF RARE  RARE   WBC, UA 0-2  <3 WBC/hpf   Bacteria, UA RARE  RARE  POCT PREGNANCY, URINE     Status: Abnormal   Collection Time    11/12/12  9:27 AM      Result Value Range   Preg Test, Ur POSITIVE (*) NEGATIVE  CBC     Status: None   Collection Time    11/12/12 10:10 AM      Result Value Range   WBC 6.7  4.0  - 10.5 K/uL   RBC 4.18  3.87 - 5.11 MIL/uL   Hemoglobin 12.5  12.0 - 15.0 g/dL   HCT 09.6  04.5 - 40.9 %   MCV 86.4  78.0 - 100.0 fL   MCH 29.9  26.0 - 34.0 pg   MCHC 34.6  30.0 - 36.0 g/dL   RDW 81.1  91.4 - 78.2 %   Platelets 239  150 - 400 K/uL  ABO/RH     Status: None   Collection Time    11/12/12 10:10 AM      Result Value Range  ABO/RH(D) O POS    HCG, QUANTITATIVE, PREGNANCY     Status: Abnormal   Collection Time    11/12/12 10:10 AM      Result Value Range   hCG, Beta Chain, Quant, S 2668 (*) <5 mIU/mL  WET PREP, GENITAL     Status: Abnormal   Collection Time    11/12/12 10:10 AM      Result Value Range   Yeast Wet Prep HPF POC NONE SEEN  NONE SEEN   Trich, Wet Prep NONE SEEN  NONE SEEN   Clue Cells Wet Prep HPF POC NONE SEEN  NONE SEEN   WBC, Wet Prep HPF POC FEW (*) NONE SEEN     US Ob Comp Less 14 Wks  11/12/2012   CLINICAL DATA:  Left lower quadrant pain.  EXAM: OBSTETRIC <14 WK [place] AND TRANSVAGINAL OB US  TECHNIQUE: Both transabdominal and transvaginal ultrasound examinations were performed for complete evaluation of the gestation as well as the maternal uterus, adnexal regions, and pelvic cul-de-sac. Transvaginal technique was performed to assess early pregnancy.  COMPARISON:  None.  FINDINGS: Intrauterine gestational sac: There is a small probable intrauterine gestational sac.  Yolk sac:  Not visualized  Embryo:  Not visualize  Cardiac Activity: Not visualized  MSD: 4.1  mm   4 w   6  d  Maternal uterus/adnexae: Grossly unremarkable. Right corpus luteum visualized.  IMPRESSION: Probable early gestational sac. No yolk sac identified. In the setting of positive pregnancy test and no definite intrauterine pregnancy, this reflects a pregnancy of unknown location. Differential considerations include early normal IUP, abnormal IUP, or nonvisualized ectopic pregnancy. Differentiation is achieved with serial beta HCG supplemented by repeat sonography as clinically warranted.   These results were called by telephone at the time of interpretation on 11/12/2012 at 11:49 AM to Dr. Victorino Dike [facility] , who verbally acknowledged these results.   Electronically Signed   By: Annia Belt M.D.   On: 11/12/2012 11:51   US Ob Transvaginal  11/12/2012   CLINICAL DATA:  Left lower quadrant pain.  EXAM: OBSTETRIC <14 WK [place] AND TRANSVAGINAL OB US  TECHNIQUE: Both transabdominal and transvaginal ultrasound examinations were performed for complete evaluation of the gestation as well as the maternal uterus, adnexal regions, and pelvic cul-de-sac. Transvaginal technique was performed to assess early pregnancy.  COMPARISON:  None.  FINDINGS: Intrauterine gestational sac: There is a small probable intrauterine gestational sac.  Yolk sac:  Not visualized  Embryo:  Not visualize  Cardiac Activity: Not visualized  MSD: 4.1  mm   4 w   6  d  Maternal uterus/adnexae: Grossly unremarkable. Right corpus luteum visualized.  IMPRESSION: Probable early gestational sac. No yolk sac identified. In the setting of positive pregnancy test and no definite intrauterine pregnancy, this reflects a pregnancy of unknown location. Differential considerations include early normal IUP, abnormal IUP, or nonvisualized ectopic pregnancy. Differentiation is achieved with serial beta HCG supplemented by repeat sonography as clinically warranted.  These results were called by telephone at the time of interpretation on 11/12/2012 at 11:49 AM to Dr. Victorino Dike [facility] , who verbally acknowledged these results.   Electronically Signed   By: Annia Belt M.D.   On: 11/12/2012 11:51    MAU Course  Procedures None  MDM -H&P -UPT (+) -UA -Discussed (+) pregnancy test with patient.  She shook her head in disbelief, but stated that she was okay with it.  -Vaginal exam with speculum: GC/C, wet prep; bimanual exam  -  Few WBCs on wet prep; No clue cells, trich, or yeast; GC/C pending  -hCG Quant, CBC, Rh/ABO -Transvaginal U/S: possible  intrauterine gestational sac    Assessment and Plan  A: -Pregnancy, [redacted]w[redacted]d by LMP -probable early gestational sac on US  -abdominal pain in pregnancy   P: - discharge home - Return in 48 hours for repeat beta hcg level  -Ectopic precautions discussed at length with patient  -Pelvic rest -Return to MAU with any worsening cramping/pain or bleeding, or any new symptoms -F/U with [facility] for initial pre-natal visit and evaluation   Christiana Fuchs 11/12/2012, 11:47 AM   Evaluation and management procedures were performed by the under my supervision and collaboration. I have reviewed the note and chart, and I agree with the management and plan.  Iona Hansen Rogerio Boutelle, NP 11/12/2012 12:24 PM

## 2012-11-14 ENCOUNTER — Inpatient Hospital Stay (HOSPITAL_COMMUNITY)
Admission: AD | Admit: 2012-11-14 | Discharge: 2012-11-14 | Disposition: A | Discharge status: Home / Self Care | Payer: BC Managed Care – PPO | Source: Ambulatory Visit | Attending: Obstetrics & Gynecology | Admitting: Obstetrics & Gynecology

## 2012-11-14 DIAGNOSIS — O99891 Other specified diseases and conditions complicating pregnancy: Secondary | ICD-10-CM | POA: Insufficient documentation

## 2012-11-14 DIAGNOSIS — R109 Unspecified abdominal pain: Secondary | ICD-10-CM | POA: Insufficient documentation

## 2012-11-14 DIAGNOSIS — O26899 Other specified pregnancy related conditions, unspecified trimester: Secondary | ICD-10-CM

## 2012-11-14 DIAGNOSIS — O09529 Supervision of elderly multigravida, unspecified trimester: Secondary | ICD-10-CM | POA: Insufficient documentation

## 2012-11-14 LAB — HCG, QUANTITATIVE, PREGNANCY: hCG, Beta Chain, Quant, S: 4851 m[IU]/mL — ABNORMAL HIGH (ref <5)

## 2012-11-14 NOTE — MAU Note (Signed)
Pt here for repeat BHGC. Having intermittent sharp pain on right side, whereas pain was on left side two days ago. Denies bleeding

## 2012-11-14 NOTE — MAU Provider Note (Signed)
  History     CSN: [id number]  Arrival date and time: 11/14/12 1528   None     Chief Complaint  Patient presents with  . Follow up BHCG    HPI Comments: Carmen Lewis 37 y.o. [id number] comes to MAU for follow up on BHCG. On 11/12/12 she was 2669, today she is 4851. She states her pain is decreased to 4/10 from 2 days ago when it was 8/10. No bleeding.        Past Medical History  Diagnosis Date  . DVT (deep venous thrombosis) 07/2010    right leg  . Headache disorder   . Osteoporosis     LEFT HIP  . Contraceptive management     Past Surgical History  Procedure Laterality Date  . Chest pain      Family History  Problem Relation Age of Onset  . Hypertension Father   . Hypertension Maternal Aunt   . Hypertension Paternal Uncle   . Hypertension Maternal Grandmother   . Hypertension Maternal Grandfather   . Hypertension Paternal Grandmother     History  Substance Use Topics  . Smoking status: Never Smoker   . Smokeless tobacco: Never Used  . Alcohol Use: No    Allergies:  Allergies  Allergen Reactions  . Latex Itching    No prescriptions prior to admission    Review of Systems  Constitutional: Negative.   HENT: Negative.   Eyes: Negative.   Respiratory: Negative.   Cardiovascular: Negative.   Gastrointestinal: Positive for abdominal pain.       Much improved  Genitourinary: Negative.   Musculoskeletal: Negative.   Skin: Negative.   Neurological: Negative.   Endo/Heme/Allergies: Negative.   Psychiatric/Behavioral: Negative.    Physical Exam   Blood pressure 129/71, pulse 71, temperature 99 F (37.2 C), temperature source Oral, resp. rate 16, height 5\' 4"  (1.626 m), weight 74.163 kg (163 lb 8 oz), last menstrual period 10/12/2012.  Physical Exam  Constitutional: She is oriented to person, place, and time. She appears well-developed and well-nourished. No distress.  Eyes: Pupils are equal, round, and reactive to light.  Neurological: She is  alert and oriented to person, place, and time.  Skin: Skin is warm and dry.  Psychiatric: Her mood appears anxious.    MAU Course  Procedures  MDM  Repeat BHCG  Assessment and Plan   A: R/O ectopic  P: Will repeat BHCG in 48 hours X 1  Repeat U/S on 11/ 18 / 14 which will be her day off and 10 days from first one Continue Ectopic Precautions  Dontavious Emily, Rubbie Battiest 11/14/2012, 6:28 PM

## 2012-11-16 NOTE — MAU Provider Note (Signed)
Attestation of Attending Supervision of Advanced Practitioner (CNM/NP): Evaluation and management procedures were performed by the Advanced Practitioner under my supervision and collaboration. I have reviewed the Advanced Practitioner's note and chart, and I agree with the management and plan.  LEGGETT,KELLY H. 5:20 PM   

## 2012-11-23 ENCOUNTER — Ambulatory Visit (HOSPITAL_COMMUNITY)
Admission: RE | Admit: 2012-11-23 | Discharge: 2012-11-23 | Disposition: A | Discharge status: Home / Self Care | Payer: BC Managed Care – PPO | Source: Ambulatory Visit | Attending: Nurse Practitioner | Admitting: Nurse Practitioner

## 2012-11-23 DIAGNOSIS — R109 Unspecified abdominal pain: Secondary | ICD-10-CM | POA: Insufficient documentation

## 2012-11-23 DIAGNOSIS — O99891 Other specified diseases and conditions complicating pregnancy: Secondary | ICD-10-CM | POA: Insufficient documentation

## 2012-11-23 DIAGNOSIS — Z3689 Encounter for other specified antenatal screening: Secondary | ICD-10-CM | POA: Insufficient documentation

## 2012-11-23 DIAGNOSIS — O26899 Other specified pregnancy related conditions, unspecified trimester: Secondary | ICD-10-CM
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
## 2012-12-16 ENCOUNTER — Emergency Department (HOSPITAL_COMMUNITY)
Admission: EM | Admit: 2012-12-16 | Discharge: 2012-12-16 | Disposition: A | Discharge status: Home / Self Care | Payer: BC Managed Care – PPO | Attending: Emergency Medicine | Admitting: Emergency Medicine

## 2012-12-16 ENCOUNTER — Encounter (HOSPITAL_COMMUNITY): Payer: Self-pay | Admitting: Emergency Medicine

## 2012-12-16 DIAGNOSIS — R519 Headache, unspecified: Secondary | ICD-10-CM

## 2012-12-16 DIAGNOSIS — Z87828 Personal history of other (healed) physical injury and trauma: Secondary | ICD-10-CM | POA: Insufficient documentation

## 2012-12-16 DIAGNOSIS — R110 Nausea: Secondary | ICD-10-CM | POA: Insufficient documentation

## 2012-12-16 DIAGNOSIS — Z8739 Personal history of other diseases of the musculoskeletal system and connective tissue: Secondary | ICD-10-CM | POA: Insufficient documentation

## 2012-12-16 DIAGNOSIS — Z91040 Latex allergy status: Secondary | ICD-10-CM | POA: Insufficient documentation

## 2012-12-16 DIAGNOSIS — H53149 Visual discomfort, unspecified: Secondary | ICD-10-CM | POA: Insufficient documentation

## 2012-12-16 DIAGNOSIS — R51 Headache: Secondary | ICD-10-CM | POA: Insufficient documentation

## 2012-12-16 DIAGNOSIS — R42 Dizziness and giddiness: Secondary | ICD-10-CM | POA: Insufficient documentation

## 2012-12-16 DIAGNOSIS — Z86718 Personal history of other venous thrombosis and embolism: Secondary | ICD-10-CM | POA: Insufficient documentation

## 2012-12-16 MED ORDER — KETOROLAC TROMETHAMINE 30 MG/ML IJ SOLN
30.0000 mg | Freq: Once | INTRAMUSCULAR | Status: AC
  Administered 2012-12-16: 30 mg via INTRAVENOUS
  Filled 2012-12-16: qty 1

## 2012-12-16 MED ORDER — DIPHENHYDRAMINE HCL 50 MG/ML IJ SOLN
25.0000 mg | Freq: Once | INTRAMUSCULAR | Status: AC
  Administered 2012-12-16: 25 mg via INTRAVENOUS
  Filled 2012-12-16: qty 1

## 2012-12-16 MED ORDER — METOCLOPRAMIDE HCL 5 MG/ML IJ SOLN
10.0000 mg | Freq: Once | INTRAMUSCULAR | Status: AC
  Administered 2012-12-16: 10 mg via INTRAVENOUS
  Filled 2012-12-16: qty 2

## 2012-12-16 MED ORDER — TRAMADOL HCL 50 MG PO TABS: 50.0000 mg | ORAL_TABLET | Freq: Four times a day (QID) | ORAL | Status: DC | PRN

## 2012-12-16 MED ORDER — SODIUM CHLORIDE 0.9 % IV BOLUS (SEPSIS)
1000.0000 mL | Freq: Once | INTRAVENOUS | Status: AC
  Administered 2012-12-16: 1000 mL via INTRAVENOUS

## 2012-12-16 NOTE — ED Provider Notes (Signed)
This patient was seen in conjunction with the resident physician, Dr. Yetta Barre.  The documentation is accurate and details the evaluation.  On my exam, The patient was uncomfortable appearing, but in no distress.  She was n/v appropriate and HD stable. She improved substantially here, and was d/c in stable condition.   Gerhard Munch, MD 12/16/12 1929

## 2012-12-16 NOTE — ED Notes (Signed)
MD at bedside. 

## 2012-12-16 NOTE — ED Notes (Addendum)
Pt c/o HA x 2 days in temporal area with some pain in back of head and some dizziness; pt denies nausea or vision change; pt sts had an elective abortion 2 weeks ago and still having some vaginal bleeding

## 2012-12-16 NOTE — ED Provider Notes (Signed)
Medical screening examination/treatment/procedure(s) were conducted as a shared visit with non-physician practitioner(s) or resident  and myself.  I personally evaluated the patient during the encounter and agree with the findings and plan unless otherwise indicated.    I have personally reviewed any xrays and/ or EKG's with the provider and I agree with interpretation.   Gradual onset HA, bilateral temporal, similar to previous but more severe.  Blood clot hx. No travel or neck stiffness, no fevers. Fairly constant.  5+ strength in UE and LE with f/e at major joints. Sensation to palpation intact in UE and LE. CNs 2-12 grossly intact.  EOMFI.  PERRL.   Finger nose and coordination intact bilateral.   Visual fields intact to finger testing. No signs of meningitis, very low suspicion SAH, very low suspicion for acute emergent HA.   Plan for migraine cocktail and recheck.  Resident will recheck, if no improvement or worsening consider CT head or MRV brain. Signed out to follow treatment.    Headache  Enid Skeens, MD 12/17/12 2046
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
## 2012-12-20 ENCOUNTER — Telehealth: Payer: Self-pay | Admitting: Family Medicine

## 2012-12-20 NOTE — Telephone Encounter (Signed)
ER letter sent.  Pt hasn't been seen here in over 1 1/2 years.

## 2013-01-12 ENCOUNTER — Inpatient Hospital Stay (HOSPITAL_COMMUNITY)
Admission: AD | Admit: 2013-01-12 | Discharge: 2013-01-12 | Disposition: A | Discharge status: Home / Self Care | Payer: Self-pay | Source: Ambulatory Visit | Attending: Obstetrics & Gynecology | Admitting: Obstetrics & Gynecology

## 2013-01-12 ENCOUNTER — Encounter (HOSPITAL_COMMUNITY): Payer: Self-pay | Admitting: *Deleted

## 2013-01-12 DIAGNOSIS — Z86718 Personal history of other venous thrombosis and embolism: Secondary | ICD-10-CM | POA: Insufficient documentation

## 2013-01-12 DIAGNOSIS — N938 Other specified abnormal uterine and vaginal bleeding: Secondary | ICD-10-CM | POA: Insufficient documentation

## 2013-01-12 DIAGNOSIS — T384X5A Adverse effect of oral contraceptives, initial encounter: Secondary | ICD-10-CM | POA: Insufficient documentation

## 2013-01-12 DIAGNOSIS — N949 Unspecified condition associated with female genital organs and menstrual cycle: Secondary | ICD-10-CM

## 2013-01-12 LAB — HCG, QUANTITATIVE, PREGNANCY: HCG, BETA CHAIN, QUANT, S: 8 m[IU]/mL — AB (ref <5)

## 2013-01-12 LAB — URINE MICROSCOPIC-ADD ON

## 2013-01-12 LAB — URINALYSIS, ROUTINE W REFLEX MICROSCOPIC
Bilirubin Urine: NEGATIVE
GLUCOSE, UA: NEGATIVE mg/dL
Ketones, ur: NEGATIVE mg/dL
Leukocytes, UA: NEGATIVE
Nitrite: NEGATIVE
PROTEIN: NEGATIVE mg/dL
SPECIFIC GRAVITY, URINE: 1.025 (ref 1.005–1.030)
Urobilinogen, UA: 0.2 mg/dL (ref 0.0–1.0)
pH: 6 (ref 5.0–8.0)

## 2013-01-12 LAB — CBC
HEMATOCRIT: 32 % — AB (ref 36.0–46.0)
HEMOGLOBIN: 10.6 g/dL — AB (ref 12.0–15.0)
MCH: 29.5 pg (ref 26.0–34.0)
MCHC: 33.1 g/dL (ref 30.0–36.0)
MCV: 89.1 fL (ref 78.0–100.0)
Platelets: 258 10*3/uL (ref 150–400)
RBC: 3.59 MIL/uL — ABNORMAL LOW (ref 3.87–5.11)
RDW: 13.8 % (ref 11.5–15.5)
WBC: 8.9 10*3/uL (ref 4.0–10.5)

## 2013-01-12 LAB — WET PREP, GENITAL
Trich, Wet Prep: NONE SEEN
YEAST WET PREP: NONE SEEN

## 2013-01-12 LAB — POCT PREGNANCY, URINE: Preg Test, Ur: NEGATIVE

## 2013-01-12 MED ORDER — NORETHINDRONE-ETH ESTRADIOL 1-35 MG-MCG PO TABS: 1.0000 | ORAL_TABLET | Freq: Every day | ORAL | Status: DC

## 2013-01-12 MED ORDER — MEDROXYPROGESTERONE ACETATE 150 MG/ML IM SUSP
150.0000 mg | Freq: Once | INTRAMUSCULAR | Status: AC
  Administered 2013-01-12: 150 mg via INTRAMUSCULAR
  Filled 2013-01-12: qty 1

## 2013-01-12 NOTE — Discharge Instructions (Signed)

## 2013-01-12 NOTE — MAU Note (Signed)
TAB done week after Thanksgiving, chemical.  Pt has been bleeding ever since, pt followed up with abortion clinic, was given BCP.  Bleeding has continued, has changed 2 pads today.  Pt states bleeding is worse in the evening.
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
## 2013-01-13 LAB — GC/CHLAMYDIA PROBE AMP
CT PROBE, AMP APTIMA: NEGATIVE
GC Probe RNA: NEGATIVE

## 2013-01-13 NOTE — MAU Provider Note (Signed)
`````  Attestation of Attending Supervision of Advanced Practitioner: Evaluation and management procedures were performed by the PA/NP/CNM/OB Fellow under my supervision/collaboration. Chart reviewed and agree with management and plan.  Karimah Winquist V 01/13/2013 9:50 AM

## 2013-03-30 ENCOUNTER — Emergency Department (HOSPITAL_COMMUNITY)
Admission: EM | Admit: 2013-03-30 | Discharge: 2013-03-30 | Disposition: A | Discharge status: Home / Self Care | Payer: Self-pay | Attending: Emergency Medicine | Admitting: Emergency Medicine

## 2013-03-30 ENCOUNTER — Encounter (HOSPITAL_COMMUNITY): Payer: Self-pay | Admitting: Emergency Medicine

## 2013-03-30 ENCOUNTER — Emergency Department (HOSPITAL_COMMUNITY): Payer: Self-pay

## 2013-03-30 DIAGNOSIS — M79604 Pain in right leg: Secondary | ICD-10-CM

## 2013-03-30 DIAGNOSIS — M810 Age-related osteoporosis without current pathological fracture: Secondary | ICD-10-CM | POA: Insufficient documentation

## 2013-03-30 DIAGNOSIS — S7010XA Contusion of unspecified thigh, initial encounter: Secondary | ICD-10-CM | POA: Insufficient documentation

## 2013-03-30 DIAGNOSIS — Y939 Activity, unspecified: Secondary | ICD-10-CM | POA: Insufficient documentation

## 2013-03-30 DIAGNOSIS — Z79899 Other long term (current) drug therapy: Secondary | ICD-10-CM | POA: Insufficient documentation

## 2013-03-30 DIAGNOSIS — Z91040 Latex allergy status: Secondary | ICD-10-CM | POA: Insufficient documentation

## 2013-03-30 DIAGNOSIS — Y929 Unspecified place or not applicable: Secondary | ICD-10-CM | POA: Insufficient documentation

## 2013-03-30 DIAGNOSIS — X58XXXA Exposure to other specified factors, initial encounter: Secondary | ICD-10-CM | POA: Insufficient documentation

## 2013-03-30 DIAGNOSIS — M79609 Pain in unspecified limb: Secondary | ICD-10-CM | POA: Insufficient documentation

## 2013-03-30 DIAGNOSIS — Z86718 Personal history of other venous thrombosis and embolism: Secondary | ICD-10-CM | POA: Insufficient documentation

## 2013-03-30 DIAGNOSIS — M79605 Pain in left leg: Secondary | ICD-10-CM

## 2013-03-30 LAB — CBC
HCT: 38.5 % (ref 36.0–46.0)
HEMOGLOBIN: 13.1 g/dL (ref 12.0–15.0)
MCH: 29.6 pg (ref 26.0–34.0)
MCHC: 34 g/dL (ref 30.0–36.0)
MCV: 87.1 fL (ref 78.0–100.0)
Platelets: 272 10*3/uL (ref 150–400)
RBC: 4.42 MIL/uL (ref 3.87–5.11)
RDW: 13.6 % (ref 11.5–15.5)
WBC: 9 10*3/uL (ref 4.0–10.5)

## 2013-03-30 LAB — BASIC METABOLIC PANEL
BUN: 14 mg/dL (ref 6–23)
CO2: 24 meq/L (ref 19–32)
Calcium: 9.3 mg/dL (ref 8.4–10.5)
Chloride: 106 mEq/L (ref 96–112)
Creatinine, Ser: 0.84 mg/dL (ref 0.50–1.10)
GFR calc Af Amer: >90 mL/min (ref >90)
GFR calc non Af Amer: 88 mL/min — ABNORMAL LOW (ref >90)
GLUCOSE: 113 mg/dL — AB (ref 70–99)
POTASSIUM: 3.9 meq/L (ref 3.7–5.3)
Sodium: 142 mEq/L (ref 137–147)

## 2013-03-30 LAB — I-STAT TROPONIN, ED: Troponin i, poc: 0 ng/mL (ref 0.00–0.08)

## 2013-03-30 LAB — D-DIMER, QUANTITATIVE (NOT AT ARMC): D DIMER QUANT: 0.56 ug{FEU}/mL — AB (ref 0.00–0.48)

## 2013-03-30 LAB — PROTIME-INR
INR: 1.01 (ref 0.00–1.49)
Prothrombin Time: 13.1 seconds (ref 11.6–15.2)

## 2013-03-30 MED ORDER — ENOXAPARIN SODIUM 80 MG/0.8ML ~~LOC~~ SOLN
1.0000 mg/kg | Freq: Once | SUBCUTANEOUS | Status: AC
  Administered 2013-03-30: 75 mg via SUBCUTANEOUS
  Filled 2013-03-30: qty 0.8

## 2013-03-30 NOTE — Discharge Instructions (Signed)
Today you were evaluated for leg pain in the leg you have had a DVT on. Today it is unlikely that you have a DVT given your physical exam, but you have been scheduled for a doppler ultrasound tomorrow morning. [phone] for this study. Please return sooner if you develop shortness of breath or any symptom concerning to you. Please follow up with your PCP.

## 2013-03-30 NOTE — ED Notes (Signed)
O/2 95% hr 95

## 2013-03-30 NOTE — ED Provider Notes (Signed)
CSN: [id number]     Arrival date & time 03/30/13  1522 History   First MD Initiated Contact with Patient 03/30/13 1934     Chief Complaint  Patient presents with  . Shortness of Breath  . Leg Pain     (Consider location/radiation/quality/duration/timing/severity/associated sxs/prior Treatment) HPI Comments: Patient is a 38 year old female with history of DVT who presents today with right sided leg pain. She reports the DVT was in 2013. She was anticoagulated with coumadin and lovenox at that time. Her last ultrasound was in December of 2013 which showed no clot. Today her boyfriend noticed that she had a few bruises on her leg in the same leg as her DVT. The bruises are tender to palpation. She denies any injury or trauma. She reports no other reason for the bruises. Her right leg feels heavier than her left which she reports is chronic since the time of the DVT. She noticed that today she developed shortness of breath when walking from the parking lot to the ED. She is unsure if this is related to anxiety because she is worried about her legs. She denies any recent surgeries or active cancer. She had a trip to [facility] in the middle of January which is a 3 hour plane ride.    Patient is a 38 y.o. female presenting with shortness of breath and leg pain. The history is provided by the patient. No language interpreter was used.  Shortness of Breath Associated symptoms: no abdominal pain, no chest pain, no diaphoresis, no fever and no vomiting   Leg Pain Associated symptoms: no fever     Past Medical History  Diagnosis Date  . DVT (deep venous thrombosis) 07/2010    right leg  . Headache disorder   . Osteoporosis     LEFT HIP  . Contraceptive management    Past Surgical History  Procedure Laterality Date  . Chest pain     Family History  Problem Relation Age of Onset  . Hypertension Father   . Hypertension Maternal Aunt   . Hypertension Paternal Uncle   . Hypertension Maternal  Grandmother   . Hypertension Maternal Grandfather   . Hypertension Paternal Grandmother    History  Substance Use Topics  . Smoking status: Never Smoker   . Smokeless tobacco: Never Used  . Alcohol Use: No   OB History   Grav Para Term Preterm Abortions TAB SAB Ect Mult Living   4 2 2  1 1    2      Review of Systems  Constitutional: Negative for fever, chills and diaphoresis.  Respiratory: Positive for shortness of breath.   Cardiovascular: Negative for chest pain and leg swelling.  Gastrointestinal: Negative for nausea, vomiting and abdominal pain.  Musculoskeletal: Positive for myalgias.  Skin: Positive for color change.  All other systems reviewed and are negative.      Allergies  Latex  Home Medications   Current Outpatient Rx  Name  Route  Sig  Dispense  Refill  . medroxyPROGESTERone (DEPO-SUBQ PROVERA) 104 MG/0.65ML injection   Subcutaneous   Inject 104 mg into the skin every 3 (three) months.          BP 134/73  Pulse 74  Temp(Src) 98.8 F (37.1 C) (Oral)  Resp 14  SpO2 100% Physical Exam  Nursing note and vitals reviewed. Constitutional: She is oriented to person, place, and time. She appears well-developed and well-nourished. No distress.  HENT:  Head: Normocephalic and atraumatic.  Right  Ear: External ear normal.  Left Ear: External ear normal.  Nose: Nose normal.  Mouth/Throat: Oropharynx is clear and moist.  Eyes: Conjunctivae are normal.  Neck: Normal range of motion.  Cardiovascular: Normal rate, regular rhythm, normal heart sounds, intact distal pulses and normal pulses.   Pulses:      Dorsalis pedis pulses are 2+ on the right side, and 2+ on the left side.       Posterior tibial pulses are 2+ on the right side, and 2+ on the left side.  No leg swelling. Legs equal in size bilaterally.   Pulmonary/Chest: Effort normal and breath sounds normal. No stridor. No respiratory distress. She has no wheezes. She has no rales.  Abdominal: Soft.  She exhibits no distension.  Musculoskeletal: Normal range of motion.  Neurological: She is alert and oriented to person, place, and time. She has normal strength.  Skin: Skin is warm and dry. She is not diaphoretic. No erythema.  2 cm faint bruises seen on right medial leg. 2 cm faint bruise seen on left thigh. Both mildly tender to palpation.   Psychiatric: She has a normal mood and affect. Her behavior is normal.    ED Course  Procedures (including critical care time) Labs Review Labs Reviewed  BASIC METABOLIC PANEL - Abnormal; Notable for the following:    Glucose, Bld 113 (*)    GFR calc non Af Amer 88 (*)    All other components within normal limits  D-DIMER, QUANTITATIVE - Abnormal; Notable for the following:    D-Dimer, Quant 0.56 (*)    All other components within normal limits  CBC  PROTIME-INR  Rosezena Sensor, ED   Imaging Review Dg Chest 2 View  03/30/2013   CLINICAL DATA:  Shortness of breath. Reported history of deep venous thrombosis in the right lower extremity.  EXAM: CHEST  2 VIEW  COMPARISON:  DG CHEST 2 VIEW dated 07/04/2012  FINDINGS: The heart size and mediastinal contours are within normal limits. Both lungs are clear. The visualized skeletal structures are unremarkable. Leftward curvature of the upper thoracic spine is reidentified without vertebral body anomaly.  IMPRESSION: No active cardiopulmonary disease.   Electronically Signed   By: Christiana Pellant M.D.   On: 03/30/2013 16:52     EKG Interpretation   Date/Time:  Wednesday March 30 2013 15:42:23 EDT Ventricular Rate:  73 PR Interval:  144 QRS Duration: 74 QT Interval:  376 QTC Calculation: 414 R Axis:   44 Text Interpretation:  Normal sinus rhythm Normal ECG No significant change  since last tracing Confirmed by HARRISON  MD, FORREST ([id number]) on 03/30/2013  9:19:30 PM      MDM   Final diagnoses:  Leg pain, bilateral    Patient presents to ED with bilateral leg pain and what appears to be  mild, tender bruises. I have very low suspicion for DVT, but given elevated d dimer and hx of DVT will anticoagulate with Lovenox and order duplex studies for the am. Patient reports 1 episode of brief, mild shortness of breath. She is not currently short of breath. No pleuritic pain. Patient is not tachycardic or hypoxic. Patient was able to ambulate in the ED easily without dropping her oxygen saturation.  I do not believe patient has a PE and do not feel as though CTA is indicated at this time. Patient will return for her vascular ultrasounds tomorrow morning. Discussed reasons to return to ED immediately. Follow up with PCP. Vital signs stable for discharge.  Dr. Romeo AppleHarrison evaluated patient and agrees with plan. Patient / Family / Caregiver informed of clinical course, understand medical decision-making process, and agree with plan.  Mora BellmanHannah S Rhiannon Sassaman, PA-C 03/31/13 [phone]

## 2013-03-30 NOTE — ED Notes (Signed)
Pt c/o SOB and left leg pain with some bruising to left leg; pt sts hx of DVT in leg

## 2013-03-31 ENCOUNTER — Ambulatory Visit (HOSPITAL_COMMUNITY)
Admission: RE | Admit: 2013-03-31 | Discharge: 2013-03-31 | Disposition: A | Discharge status: Home / Self Care | Payer: Self-pay | Source: Ambulatory Visit | Attending: Emergency Medicine | Admitting: Emergency Medicine

## 2013-03-31 DIAGNOSIS — Z86718 Personal history of other venous thrombosis and embolism: Secondary | ICD-10-CM | POA: Insufficient documentation

## 2013-03-31 DIAGNOSIS — M79609 Pain in unspecified limb: Secondary | ICD-10-CM

## 2013-03-31 NOTE — Progress Notes (Signed)
*  PRELIMINARY RESULTS* Vascular Ultrasound Lower extremity venous duplex has been completed.  Preliminary findings: No evidence of DVT.    Farrel DemarkJill Eunice, RDMS, RVT  03/31/2013, 11:22 AM

## 2013-04-01 NOTE — ED Provider Notes (Signed)
Medical screening examination/treatment/procedure(s) were conducted as a shared visit with non-physician practitioner(s) and myself.  I personally evaluated the patient during the encounter.   EKG Interpretation   Date/Time:  Wednesday March 30 2013 15:42:23 EDT Ventricular Rate:  73 PR Interval:  144 QRS Duration: 74 QT Interval:  376 QTC Calculation: 414 R Axis:   44 Text Interpretation:  Normal sinus rhythm Normal ECG No significant change  since last tracing Confirmed by Azarya Oconnell  MD, Trayshawn Durkin ([id number]) on 03/30/2013  9:19:30 PM      I interviewed and examined the patient. Lungs are CTAB. Cardiac exam wnl. Abdomen soft.  Normal appearing, symmetric LE's. 2+ distal pulses. Pt concerned d/t mild bruising noted on legs. Screening d-dimer sent by PA to eval for DVT which ended up being positive. Pt denies cp. States she had some mild sob when walking in the parking lot. Not tachy here, not sob on exam. Doubt PE and also doubt DVT, but given elevated d-dimer will give lovenox and have pt return tomorrow to r/o DVT in LE's.   Junius ArgyleForrest S Traci Plemons, MD 04/01/13 [phone]

## 2013-11-04 ENCOUNTER — Encounter (HOSPITAL_COMMUNITY): Payer: Self-pay | Admitting: Emergency Medicine

## 2013-11-04 ENCOUNTER — Emergency Department (HOSPITAL_COMMUNITY)
Admission: EM | Admit: 2013-11-04 | Discharge: 2013-11-04 | Disposition: A | Discharge status: Home / Self Care | Payer: Self-pay | Attending: Emergency Medicine | Admitting: Emergency Medicine

## 2013-11-04 DIAGNOSIS — M5442 Lumbago with sciatica, left side: Secondary | ICD-10-CM | POA: Insufficient documentation

## 2013-11-04 DIAGNOSIS — Z3202 Encounter for pregnancy test, result negative: Secondary | ICD-10-CM | POA: Insufficient documentation

## 2013-11-04 DIAGNOSIS — Z8739 Personal history of other diseases of the musculoskeletal system and connective tissue: Secondary | ICD-10-CM | POA: Insufficient documentation

## 2013-11-04 DIAGNOSIS — Z91040 Latex allergy status: Secondary | ICD-10-CM | POA: Insufficient documentation

## 2013-11-04 DIAGNOSIS — Z86718 Personal history of other venous thrombosis and embolism: Secondary | ICD-10-CM | POA: Insufficient documentation

## 2013-11-04 LAB — URINE MICROSCOPIC-ADD ON

## 2013-11-04 LAB — URINALYSIS, ROUTINE W REFLEX MICROSCOPIC
Bilirubin Urine: NEGATIVE
Glucose, UA: NEGATIVE mg/dL
KETONES UR: NEGATIVE mg/dL
NITRITE: NEGATIVE
PH: 7 (ref 5.0–8.0)
PROTEIN: NEGATIVE mg/dL
Specific Gravity, Urine: 1.018 (ref 1.005–1.030)
UROBILINOGEN UA: 1 mg/dL (ref 0.0–1.0)

## 2013-11-04 LAB — POC URINE PREG, ED: Preg Test, Ur: NEGATIVE

## 2013-11-04 MED ORDER — IBUPROFEN 800 MG PO TABS: 800.0000 mg | ORAL_TABLET | Freq: Three times a day (TID) | ORAL | Status: DC

## 2013-11-04 MED ORDER — HYDROCODONE-ACETAMINOPHEN 5-325 MG PO TABS: 2.0000 | ORAL_TABLET | ORAL | Status: DC | PRN

## 2013-11-04 MED ORDER — CYCLOBENZAPRINE HCL 10 MG PO TABS: 10.0000 mg | ORAL_TABLET | Freq: Two times a day (BID) | ORAL | Status: DC | PRN

## 2013-11-04 NOTE — Discharge Instructions (Signed)
Back Pain, Adult °Back pain is very common. The pain often gets better over time. The cause of back pain is usually not dangerous. Most people can learn to manage their back pain on their own.  °HOME CARE  °· Stay active. Start with short walks on flat ground if you can. Try to walk farther each day. °· Do not sit, drive, or stand in one place for more than 30 minutes. Do not stay in bed. °· Do not avoid exercise or work. Activity can help your back heal faster. °· Be careful when you bend or lift an object. Bend at your knees, keep the object close to you, and do not twist. °· Sleep on a firm mattress. Lie on your side, and bend your knees. If you lie on your back, put a pillow under your knees. °· Only take medicines as told by your doctor. °· Put ice on the injured area. °¨ Put ice in a plastic bag. °¨ Place a towel between your skin and the bag. °¨ Leave the ice on for 15-20 minutes, 03-04 times a day for the first 2 to 3 days. After that, you can switch between ice and heat packs. °· Ask your doctor about back exercises or massage. °· Avoid feeling anxious or stressed. Find good ways to deal with stress, such as exercise. °GET HELP RIGHT AWAY IF:  °· Your pain does not go away with rest or medicine. °· Your pain does not go away in 1 week. °· You have new problems. °· You do not feel well. °· The pain spreads into your legs. °· You cannot control when you poop (bowel movement) or pee (urinate). °· Your arms or legs feel weak or lose feeling (numbness). °· You feel sick to your stomach (nauseous) or throw up (vomit). °· You have belly (abdominal) pain. °· You feel like you may pass out (faint). °MAKE SURE YOU:  °· Understand these instructions. °· Will watch your condition. °· Will get help right away if you are not doing well or get worse. °Document Released: 06/11/2007 Document Revised: 03/17/2011 Document Reviewed: 04/26/2013 °Exit[employer]® Patient Information ©2015 [employer]. This information is not intended  to replace advice given to you by your health care provider. Make sure you discuss any questions you have with your health care provider. ° °

## 2013-11-04 NOTE — ED Provider Notes (Signed)
Medical screening examination/treatment/procedure(s) were performed by non-physician practitioner and as supervising physician I was immediately available for consultation/collaboration.     Claxton Levitz, MD 11/04/13 1527 

## 2013-11-04 NOTE — ED Notes (Signed)
Pt c/o lower back pain for two days. Pt states that she has had frequent urination but denies pain or blood in her urine.

## 2013-11-04 NOTE — ED Provider Notes (Signed)
CSN: [id number]     Arrival date & time 11/04/13  1315 History  This chart was scribed for non-physician practitioner, Langston MaskerKaren Ira Busbin, PA-C working with Geoffery Lyonsouglas Delo, MD by Greggory StallionKayla Andersen, ED scribe. This patient was seen in room WTR7/WTR7 and the patient's care was started at 2:02 PM.   Chief Complaint  Patient presents with  . Back Pain  . Urinary Frequency   Patient is a 38 y.o. female presenting with back pain. The history is provided by the patient. No language interpreter was used.  Back Pain Location:  Lumbar spine Radiates to:  Does not radiate Pain severity:  Moderate Onset quality:  Gradual Duration:  2 days Timing:  Constant Progression:  Unchanged Chronicity:  New Relieved by:  Nothing Worsened by:  Movement Ineffective treatments:  Ibuprofen Associated symptoms: no dysuria and no fever    HPI Comments: Carmen Lewis is a 38 y.o. female who presents to the Emergency Department complaining of lower back pain that started 2 days ago. Denies injury. Also reports urinary frequency. Movements worsen her pain. Pt has taken ibuprofen with no relief. Denies fever, chills, nausea, emesis, dysuria, hematuria. Denies history of back problems. Pt does not smoke or drink alcohol. Denies history of hypertension or diabetes.   Past Medical History  Diagnosis Date  . DVT (deep venous thrombosis) 07/2010    right leg  . Headache disorder   . Osteoporosis     LEFT HIP  . Contraceptive management    Past Surgical History  Procedure Laterality Date  . Chest pain     Family History  Problem Relation Age of Onset  . Hypertension Father   . Hypertension Maternal Aunt   . Hypertension Paternal Uncle   . Hypertension Maternal Grandmother   . Hypertension Maternal Grandfather   . Hypertension Paternal Grandmother    History  Substance Use Topics  . Smoking status: Never Smoker   . Smokeless tobacco: Never Used  . Alcohol Use: No   OB History   Grav Para Term Preterm Abortions  TAB SAB Ect Mult Living   4 2 2  1 1    2      Review of Systems  Constitutional: Negative for fever and chills.  Gastrointestinal: Negative for nausea and vomiting.  Genitourinary: Positive for frequency. Negative for dysuria.  Musculoskeletal: Positive for back pain.  All other systems reviewed and are negative.  Allergies  Latex  Home Medications   Prior to Admission medications   Medication Sig Start Date End Date Taking? Authorizing Provider  medroxyPROGESTERone (DEPO-SUBQ PROVERA) 104 MG/0.65ML injection Inject 104 mg into the skin every 3 (three) months.    Historical Provider, MD   BP 130/81  Pulse 66  Temp(Src) 98.7 F (37.1 C) (Oral)  Resp 18  SpO2 100%  Physical Exam  Nursing note and vitals reviewed. Constitutional: She is oriented to person, place, and time. She appears well-developed and well-nourished.  HENT:  Head: Normocephalic.  Eyes: EOM are normal.  Neck: Normal range of motion.  Pulmonary/Chest: Effort normal.  Abdominal: She exhibits no distension.  Musculoskeletal: Normal range of motion.  Left flank tender to palpation.   Neurological: She is alert and oriented to person, place, and time.  Psychiatric: She has a normal mood and affect.    ED Course  Procedures (including critical care time)  DIAGNOSTIC STUDIES: Oxygen Saturation is 100% on RA, normal by my interpretation.    COORDINATION OF CARE: 2:03 PM-Discussed treatment plan which includes UA with pt  at bedside and pt agreed to plan.   Labs Review Labs Reviewed  URINALYSIS, ROUTINE W REFLEX MICROSCOPIC - Abnormal; Notable for the following:    Hgb urine dipstick SMALL (*)    Leukocytes, UA SMALL (*)    All other components within normal limits  URINE MICROSCOPIC-ADD ON  POC URINE PREG, ED   Urine 3-6 wbc's negative nitrate  I will order culture.   Pain seems more muscular. Imaging Review No results found.   EKG Interpretation None      MDM   Final diagnoses:   Left-sided low back pain with left-sided sciatica      I personally performed the services described in this documentation, which was scribed in my presence. The recorded information has been reviewed and is accurate.  Elson AreasLeslie K Kayshawn Ozburn, PA-C 11/04/13 [phone]

## 2013-11-06 LAB — URINE CULTURE
Colony Count: NO GROWTH
Culture: NO GROWTH
SPECIAL REQUESTS: NORMAL

## 2013-11-07 ENCOUNTER — Encounter (HOSPITAL_COMMUNITY): Payer: Self-pay | Admitting: Emergency Medicine

## 2013-11-08 ENCOUNTER — Telehealth: Payer: Self-pay | Admitting: Family Medicine

## 2013-11-08 NOTE — Telephone Encounter (Signed)
ER letter sent 

## 2014-01-25 ENCOUNTER — Encounter (HOSPITAL_COMMUNITY): Payer: Self-pay | Admitting: *Deleted

## 2014-01-25 ENCOUNTER — Emergency Department (HOSPITAL_COMMUNITY)
Admission: EM | Admit: 2014-01-25 | Discharge: 2014-01-25 | Disposition: A | Discharge status: Home / Self Care | Payer: No Typology Code available for payment source | Attending: Emergency Medicine | Admitting: Emergency Medicine

## 2014-01-25 ENCOUNTER — Emergency Department (HOSPITAL_COMMUNITY): Payer: No Typology Code available for payment source

## 2014-01-25 DIAGNOSIS — Z91040 Latex allergy status: Secondary | ICD-10-CM | POA: Insufficient documentation

## 2014-01-25 DIAGNOSIS — Y92410 Unspecified street and highway as the place of occurrence of the external cause: Secondary | ICD-10-CM | POA: Insufficient documentation

## 2014-01-25 DIAGNOSIS — Z791 Long term (current) use of non-steroidal anti-inflammatories (NSAID): Secondary | ICD-10-CM | POA: Insufficient documentation

## 2014-01-25 DIAGNOSIS — Y998 Other external cause status: Secondary | ICD-10-CM | POA: Insufficient documentation

## 2014-01-25 DIAGNOSIS — Y9389 Activity, other specified: Secondary | ICD-10-CM | POA: Insufficient documentation

## 2014-01-25 DIAGNOSIS — Z86718 Personal history of other venous thrombosis and embolism: Secondary | ICD-10-CM | POA: Insufficient documentation

## 2014-01-25 DIAGNOSIS — S46812A Strain of other muscles, fascia and tendons at shoulder and upper arm level, left arm, initial encounter: Secondary | ICD-10-CM

## 2014-01-25 DIAGNOSIS — S46912A Strain of unspecified muscle, fascia and tendon at shoulder and upper arm level, left arm, initial encounter: Secondary | ICD-10-CM | POA: Insufficient documentation

## 2014-01-25 DIAGNOSIS — S199XXA Unspecified injury of neck, initial encounter: Secondary | ICD-10-CM | POA: Insufficient documentation

## 2014-01-25 DIAGNOSIS — Z8739 Personal history of other diseases of the musculoskeletal system and connective tissue: Secondary | ICD-10-CM | POA: Insufficient documentation

## 2014-01-25 MED ORDER — CYCLOBENZAPRINE HCL 10 MG PO TABS: 10.0000 mg | ORAL_TABLET | Freq: Two times a day (BID) | ORAL | Status: DC | PRN

## 2014-01-25 MED ORDER — TRAMADOL HCL 50 MG PO TABS: 50.0000 mg | ORAL_TABLET | Freq: Four times a day (QID) | ORAL | Status: DC | PRN

## 2014-01-25 MED ORDER — IBUPROFEN 600 MG PO TABS: 600.0000 mg | ORAL_TABLET | Freq: Four times a day (QID) | ORAL | Status: DC | PRN
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
## 2014-09-05 ENCOUNTER — Encounter (HOSPITAL_COMMUNITY): Payer: Self-pay | Admitting: Emergency Medicine

## 2014-09-05 ENCOUNTER — Emergency Department (HOSPITAL_COMMUNITY)
Admission: EM | Admit: 2014-09-05 | Discharge: 2014-09-05 | Disposition: A | Discharge status: Home / Self Care | Payer: Self-pay | Attending: Emergency Medicine | Admitting: Emergency Medicine

## 2014-09-05 DIAGNOSIS — M79661 Pain in right lower leg: Secondary | ICD-10-CM | POA: Insufficient documentation

## 2014-09-05 DIAGNOSIS — Z91040 Latex allergy status: Secondary | ICD-10-CM | POA: Insufficient documentation

## 2014-09-05 DIAGNOSIS — M79662 Pain in left lower leg: Secondary | ICD-10-CM | POA: Insufficient documentation

## 2014-09-05 DIAGNOSIS — Z86718 Personal history of other venous thrombosis and embolism: Secondary | ICD-10-CM | POA: Insufficient documentation

## 2014-09-05 LAB — CBC
HEMATOCRIT: 37.4 % (ref 36.0–46.0)
Hemoglobin: 12.9 g/dL (ref 12.0–15.0)
MCH: 30.6 pg (ref 26.0–34.0)
MCHC: 34.5 g/dL (ref 30.0–36.0)
MCV: 88.6 fL (ref 78.0–100.0)
Platelets: 243 10*3/uL (ref 150–400)
RBC: 4.22 MIL/uL (ref 3.87–5.11)
RDW: 12.9 % (ref 11.5–15.5)
WBC: 9.7 10*3/uL (ref 4.0–10.5)

## 2014-09-05 LAB — I-STAT CHEM 8, ED
BUN: 13 mg/dL (ref 6–20)
CREATININE: 0.7 mg/dL (ref 0.44–1.00)
Calcium, Ion: 1.21 mmol/L (ref 1.12–1.23)
Chloride: 106 mmol/L (ref 101–111)
Glucose, Bld: 91 mg/dL (ref 65–99)
HEMATOCRIT: 38 % (ref 36.0–46.0)
HEMOGLOBIN: 12.9 g/dL (ref 12.0–15.0)
Potassium: 3.6 mmol/L (ref 3.5–5.1)
Sodium: 140 mmol/L (ref 135–145)
TCO2: 21 mmol/L (ref 0–100)

## 2014-09-05 MED ORDER — ENOXAPARIN SODIUM 100 MG/ML ~~LOC~~ SOLN
1.0000 mg/kg | Freq: Once | SUBCUTANEOUS | Status: AC
  Administered 2014-09-05: 85 mg via SUBCUTANEOUS
  Filled 2014-09-05: qty 1

## 2014-09-05 NOTE — ED Notes (Signed)
Pt states that she has had a DVT before and now has pain and swelling in the L leg. Alert and oriented.
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
## 2014-09-05 NOTE — ED Notes (Signed)
Pharmacy called and notified of ht/wt.

## 2014-09-06 ENCOUNTER — Ambulatory Visit (HOSPITAL_COMMUNITY): Admission: RE | Admit: 2014-09-06 | Payer: Self-pay | Source: Ambulatory Visit

## 2015-01-07 NOTE — L&D Delivery Note (Signed)
Delivery Note At 4:57 PM a viable female was delivered via  (Presentation:vertex ; LOA ).  APGAR:8 ,9 ; weight pending .   Placenta status:spontaneous ,shultz .  Cord:  3VC with the following complications:none .  Cord pH: n/a  Anesthesia:  epidural Episiotomy:  none Lacerations:  None  Suture Repair: n/a Est. Blood Loss (mL): 200   Mom to postpartum.  Baby to Couplet care / Skin to Skin.  Carmen Lewis 11/25/2015, 5:18 PM

## 2015-02-03 ENCOUNTER — Encounter (HOSPITAL_COMMUNITY): Payer: Self-pay | Admitting: *Deleted

## 2015-02-03 ENCOUNTER — Inpatient Hospital Stay (HOSPITAL_COMMUNITY)
Admission: AD | Admit: 2015-02-03 | Discharge: 2015-02-03 | Disposition: A | Discharge status: Home / Self Care | Payer: Managed Care, Other (non HMO) | Source: Ambulatory Visit | Attending: Internal Medicine | Admitting: Internal Medicine

## 2015-02-03 DIAGNOSIS — A499 Bacterial infection, unspecified: Secondary | ICD-10-CM

## 2015-02-03 DIAGNOSIS — B9689 Other specified bacterial agents as the cause of diseases classified elsewhere: Secondary | ICD-10-CM

## 2015-02-03 DIAGNOSIS — N760 Acute vaginitis: Secondary | ICD-10-CM | POA: Insufficient documentation

## 2015-02-03 DIAGNOSIS — N939 Abnormal uterine and vaginal bleeding, unspecified: Secondary | ICD-10-CM

## 2015-02-03 LAB — WET PREP, GENITAL
Sperm: NONE SEEN
Trich, Wet Prep: NONE SEEN
Yeast Wet Prep HPF POC: NONE SEEN

## 2015-02-03 LAB — URINALYSIS, ROUTINE W REFLEX MICROSCOPIC
Bilirubin Urine: NEGATIVE
Glucose, UA: NEGATIVE mg/dL
Ketones, ur: NEGATIVE mg/dL
LEUKOCYTES UA: NEGATIVE
Nitrite: NEGATIVE
Protein, ur: NEGATIVE mg/dL
Specific Gravity, Urine: 1.025 (ref 1.005–1.030)
pH: 5.5 (ref 5.0–8.0)

## 2015-02-03 LAB — URINE MICROSCOPIC-ADD ON

## 2015-02-03 LAB — POCT PREGNANCY, URINE: PREG TEST UR: NEGATIVE

## 2015-02-03 MED ORDER — METRONIDAZOLE 500 MG PO TABS: 500.0000 mg | ORAL_TABLET | Freq: Two times a day (BID) | ORAL | Status: DC

## 2015-02-03 MED ORDER — NORETHINDRONE 0.35 MG PO TABS: 1.0000 | ORAL_TABLET | Freq: Every day | ORAL | Status: DC

## 2015-02-03 NOTE — MAU Provider Note (Signed)
History     CSN: [id number]  Arrival date and time: 02/03/15 0741   First Provider Initiated Contact with Patient 02/03/15 [phone]      Chief Complaint  Patient presents with  . Vaginal Bleeding  . Vaginal Discharge  . Abdominal Pain   HPI  [name] a 40 y.o. female [id number] presenting to MAU with abnormal vaginal bleeding and vaginal discharge.    She complains of watery discharge 2 days ago with an odor.  She tried douching yesterday and does not feel this has helped.   She is also concerned because she has not had a normal period in a few months. She was using depo for birth control and recently stopped using it.  Since then she has had irregular vaginal spotting and bleeding in bewteen periods.  Her last normal period was on January 6th and she started spotting again yesterday.   The last depo injection was in July and she has decided not to get it ago.   OB History    Gravida Para Term Preterm AB TAB SAB Ectopic Multiple Living   Past Medical History  Diagnosis Date  . DVT (deep venous thrombosis) ([facility]) 07/2010    right leg  . Headache disorder   . Osteoporosis     LEFT HIP  . Contraceptive management     Past Surgical History  Procedure Laterality Date  . No past surgeries      Family History  Problem Relation Age of Onset  . Hypertension Father   . Hypertension Maternal Aunt   . Hypertension Paternal Uncle   . Hypertension Maternal Grandmother   . Hypertension Maternal Grandfather   . Hypertension Paternal Grandmother     Social History  Substance Use Topics  . Smoking status: Never Smoker   . Smokeless tobacco: Never Used  . Alcohol Use: No    Allergies:  Allergies  Allergen Reactions  . Latex Itching    Prescriptions prior to admission  Medication Sig Dispense Refill Last Dose  . cyclobenzaprine (FLEXERIL) 10 MG tablet Take 1 tablet (10 mg total) by mouth 2 (two) times daily as needed for muscle spasms.  (Patient not taking: Reported on 09/05/2014) 20 tablet 0 Not Taking at Unknown time  . HYDROcodone-acetaminophen (NORCO/VICODIN) 5-325 MG per tablet Take 2 tablets by mouth every 4 (four) hours as needed. (Patient not taking: Reported on 01/25/2014) 10 tablet 0 Completed Course at Unknown time  . ibuprofen (ADVIL,MOTRIN) 600 MG tablet Take 1 tablet (600 mg total) by mouth every 6 (six) hours as needed. (Patient not taking: Reported on 09/05/2014) 30 tablet 0 Completed Course at Unknown time  . traMADol (ULTRAM) 50 MG tablet Take 1 tablet (50 mg total) by mouth every 6 (six) hours as needed. (Patient not taking: Reported on 09/05/2014) 15 tablet 0 Completed Course at Unknown time   Results for orders placed or performed during the hospital encounter of 02/03/15 (from the past 48 hour(s))  Urinalysis, Routine w reflex microscopic (not at [facility])     Status: Abnormal   Collection Time: 02/03/15  8:00 AM  Result Value Ref Range   Color, Urine YELLOW YELLOW   APPearance CLEAR CLEAR   Specific Gravity, Urine 1.025 1.005 - 1.030   pH 5.5 5.0 - 8.0   Glucose, UA NEGATIVE NEGATIVE mg/dL   Hgb urine dipstick LARGE (A) NEGATIVE   Bilirubin Urine NEGATIVE NEGATIVE  Ketones, ur NEGATIVE NEGATIVE mg/dL   Protein, ur NEGATIVE NEGATIVE mg/dL   Nitrite NEGATIVE NEGATIVE   Leukocytes, UA NEGATIVE NEGATIVE  Urine microscopic-add on     Status: Abnormal   Collection Time: 02/03/15  8:00 AM  Result Value Ref Range   Squamous Epithelial / LPF 0-5 (A) NONE SEEN   WBC, UA 0-5 0 - 5 WBC/hpf   RBC / HPF 6-30 0 - 5 RBC/hpf   Bacteria, UA RARE (A) NONE SEEN  Pregnancy, urine POC     Status: None   Collection Time: 02/03/15  8:12 AM  Result Value Ref Range   Preg Test, Ur NEGATIVE NEGATIVE    Comment:        THE SENSITIVITY OF THIS METHODOLOGY IS >24 mIU/mL   Wet prep, genital     Status: Abnormal   Collection Time: 02/03/15  9:10 AM  Result Value Ref Range   Yeast Wet Prep HPF POC NONE SEEN NONE SEEN   Trich,  Wet Prep NONE SEEN NONE SEEN   Clue Cells Wet Prep HPF POC PRESENT (A) NONE SEEN   WBC, Wet Prep HPF POC FEW (A) NONE SEEN    Comment: MANY BACTERIA SEEN   Sperm NONE SEEN     Review of Systems  Constitutional: Negative for fever and chills.  Gastrointestinal: Positive for abdominal pain (+ cramping. ).  Genitourinary:       Irregular bleeding/spotting    Physical Exam   Blood pressure 120/77, pulse 75, temperature 97.9 F (36.6 C), temperature source Oral, resp. rate 18, last menstrual period 01/12/2015.  Physical Exam  Constitutional: She is oriented to person, place, and time. She appears well-developed and well-nourished. No distress.  HENT:  Head: Normocephalic.  Eyes: Pupils are equal, round, and reactive to light.  GI: Soft.  Genitourinary:  Speculum exam: Vagina - Small amount of creamy, white discharge, mild odor Cervix - No contact bleeding Bimanual exam: Cervix closed Uterus non tender, normal size Adnexa non tender, no masses bilaterally GC/Chlam, wet prep done Chaperone present for exam.  Musculoskeletal: Normal range of motion.  Neurological: She is alert and oriented to person, place, and time.  Skin: She is not diaphoretic.  Psychiatric: Her behavior is normal.    MAU Course  Procedures  None  MDM  Wet prep GC Patient declined HIV testing.    Assessment and Plan   A:  1. Abnormal vaginal bleeding   2. BV (bacterial vaginosis)     P:  Discharge home in stable condition  Return to MAU for emergencies only.  RX: Flagyl, Camilla.  Follow up with GYN of choice.    Duane Lope, NP 02/03/2015 1:45 PM

## 2015-02-03 NOTE — Discharge Instructions (Signed)

## 2015-02-03 NOTE — MAU Note (Signed)
Had been on Depo injections; missed getting depo back in April or July; Is not on depo now; LNMP was Jan 6th; having spotting and vaginal discharge with a odor for past 2 days; c/o cramping also; UPT was negative today;

## 2015-02-05 LAB — GC/CHLAMYDIA PROBE AMP (~~LOC~~) NOT AT ARMC
CHLAMYDIA, DNA PROBE: NEGATIVE
Neisseria Gonorrhea: POSITIVE — AB

## 2015-02-06 ENCOUNTER — Encounter: Payer: Self-pay | Admitting: Obstetrics & Gynecology

## 2015-02-06 ENCOUNTER — Ambulatory Visit (INDEPENDENT_AMBULATORY_CARE_PROVIDER_SITE_OTHER): Payer: Managed Care, Other (non HMO) | Admitting: General Practice

## 2015-02-06 VITALS — BP 144/81 | HR 77 | Temp 98.2°F | Ht 64.0 in | Wt 201.0 lb

## 2015-02-06 DIAGNOSIS — A549 Gonococcal infection, unspecified: Secondary | ICD-10-CM | POA: Insufficient documentation

## 2015-02-06 MED ORDER — AZITHROMYCIN 250 MG PO TABS
1000.0000 mg | ORAL_TABLET | Freq: Once | ORAL | Status: AC
  Administered 2015-02-06: 1000 mg via ORAL

## 2015-02-06 MED ORDER — CEFTRIAXONE SODIUM 250 MG IJ SOLR
250.0000 mg | Freq: Once | INTRAMUSCULAR | Status: AC
  Administered 2015-02-06: 250 mg via INTRAMUSCULAR

## 2015-02-06 NOTE — Progress Notes (Signed)
Quick Note:  Patient has Gonorrhea. Recommend testing for other STIs, also needs to let partner(s) know so the partner(s) can get testing and treatment. Patient and sex partner(s) should abstain from unprotected sexual activity for seven days after everyone receives appropriate treatment. Ceftriaxone and Azithromycin were prescribed for patient; she has to come to clinic to receive medications. Please call to inform patient of results and recommendations, and tell to come to clinic to receive Ceftriaxone injection and Azithromycin. Orders are signed and held, need to be released by RN (if there is any trouble doing this in clinic, they can be reordered).  Jaynie Collins, MD, FACOG Attending Obstetrician & Gynecologist, [facility] and [facility] for [facility]   ______

## 2015-02-13 ENCOUNTER — Ambulatory Visit (INDEPENDENT_AMBULATORY_CARE_PROVIDER_SITE_OTHER): Payer: Managed Care, Other (non HMO) | Admitting: *Deleted

## 2015-02-13 DIAGNOSIS — N390 Urinary tract infection, site not specified: Secondary | ICD-10-CM | POA: Diagnosis not present

## 2015-02-13 LAB — POCT URINALYSIS DIP (DEVICE)
Bilirubin Urine: NEGATIVE
Glucose, UA: NEGATIVE mg/dL
Ketones, ur: NEGATIVE mg/dL
NITRITE: NEGATIVE
Protein, ur: 30 mg/dL — AB
Specific Gravity, Urine: 1.02 (ref 1.005–1.030)
UROBILINOGEN UA: 1 mg/dL (ref 0.0–1.0)
pH: 7.5 (ref 5.0–8.0)

## 2015-02-13 LAB — OB RESULTS CONSOLE GBS: STREP GROUP B AG: POSITIVE

## 2015-02-13 NOTE — Progress Notes (Signed)
Pt came to clinic and states that she was told to come in and give a urine sample for a culture. Urinalysis ran and urine culture ordered.

## 2015-02-14 LAB — URINE CULTURE
Colony Count: NO GROWTH
Organism ID, Bacteria: NO GROWTH

## 2015-03-01 ENCOUNTER — Inpatient Hospital Stay (HOSPITAL_COMMUNITY)
Admission: AD | Admit: 2015-03-01 | Discharge: 2015-03-02 | Disposition: A | Discharge status: Home / Self Care | Payer: Managed Care, Other (non HMO) | Source: Ambulatory Visit | Attending: Obstetrics & Gynecology | Admitting: Obstetrics & Gynecology

## 2015-03-01 DIAGNOSIS — R1032 Left lower quadrant pain: Secondary | ICD-10-CM

## 2015-03-01 DIAGNOSIS — B9689 Other specified bacterial agents as the cause of diseases classified elsewhere: Secondary | ICD-10-CM

## 2015-03-01 DIAGNOSIS — M810 Age-related osteoporosis without current pathological fracture: Secondary | ICD-10-CM | POA: Insufficient documentation

## 2015-03-01 DIAGNOSIS — Z86718 Personal history of other venous thrombosis and embolism: Secondary | ICD-10-CM | POA: Insufficient documentation

## 2015-03-01 DIAGNOSIS — N760 Acute vaginitis: Secondary | ICD-10-CM | POA: Insufficient documentation

## 2015-03-02 ENCOUNTER — Inpatient Hospital Stay (HOSPITAL_COMMUNITY): Payer: Managed Care, Other (non HMO)

## 2015-03-02 ENCOUNTER — Encounter (HOSPITAL_COMMUNITY): Payer: Self-pay | Admitting: *Deleted

## 2015-03-02 DIAGNOSIS — A499 Bacterial infection, unspecified: Secondary | ICD-10-CM | POA: Diagnosis not present

## 2015-03-02 DIAGNOSIS — M810 Age-related osteoporosis without current pathological fracture: Secondary | ICD-10-CM | POA: Diagnosis not present

## 2015-03-02 DIAGNOSIS — N760 Acute vaginitis: Secondary | ICD-10-CM

## 2015-03-02 DIAGNOSIS — Z86718 Personal history of other venous thrombosis and embolism: Secondary | ICD-10-CM | POA: Diagnosis not present

## 2015-03-02 DIAGNOSIS — R109 Unspecified abdominal pain: Secondary | ICD-10-CM | POA: Diagnosis present

## 2015-03-02 DIAGNOSIS — R1032 Left lower quadrant pain: Secondary | ICD-10-CM | POA: Diagnosis not present

## 2015-03-02 LAB — CBC
HEMATOCRIT: 39.1 % (ref 36.0–46.0)
HEMOGLOBIN: 13.3 g/dL (ref 12.0–15.0)
MCH: 30.2 pg (ref 26.0–34.0)
MCHC: 34 g/dL (ref 30.0–36.0)
MCV: 88.7 fL (ref 78.0–100.0)
Platelets: 274 10*3/uL (ref 150–400)
RBC: 4.41 MIL/uL (ref 3.87–5.11)
RDW: 13.3 % (ref 11.5–15.5)
WBC: 11 10*3/uL — ABNORMAL HIGH (ref 4.0–10.5)

## 2015-03-02 LAB — POCT PREGNANCY, URINE: Preg Test, Ur: NEGATIVE

## 2015-03-02 LAB — HIV ANTIBODY (ROUTINE TESTING W REFLEX): HIV Screen 4th Generation wRfx: NONREACTIVE

## 2015-03-02 LAB — WET PREP, GENITAL
SPERM: NONE SEEN
TRICH WET PREP: NONE SEEN
WBC, Wet Prep HPF POC: NONE SEEN
YEAST WET PREP: NONE SEEN

## 2015-03-02 LAB — URINALYSIS, ROUTINE W REFLEX MICROSCOPIC
Bilirubin Urine: NEGATIVE
Glucose, UA: NEGATIVE mg/dL
KETONES UR: NEGATIVE mg/dL
LEUKOCYTES UA: NEGATIVE
NITRITE: NEGATIVE
Protein, ur: NEGATIVE mg/dL
SPECIFIC GRAVITY, URINE: 1.01 (ref 1.005–1.030)
pH: 7 (ref 5.0–8.0)

## 2015-03-02 LAB — RPR: RPR: NONREACTIVE

## 2015-03-02 LAB — URINE MICROSCOPIC-ADD ON

## 2015-03-02 MED ORDER — KETOROLAC TROMETHAMINE 60 MG/2ML IM SOLN
60.0000 mg | Freq: Once | INTRAMUSCULAR | Status: DC
  Filled 2015-03-02: qty 2

## 2015-03-02 MED ORDER — KETOROLAC TROMETHAMINE 60 MG/2ML IM SOLN
60.0000 mg | Freq: Once | INTRAMUSCULAR | Status: AC
  Administered 2015-03-02: 60 mg via INTRAMUSCULAR

## 2015-03-02 MED ORDER — METRONIDAZOLE 500 MG PO TABS: 500.0000 mg | ORAL_TABLET | Freq: Two times a day (BID) | ORAL | Status: DC

## 2015-03-02 MED ORDER — IBUPROFEN 800 MG PO TABS: 800.0000 mg | ORAL_TABLET | Freq: Once | ORAL | Status: DC

## 2015-03-02 NOTE — MAU Provider Note (Signed)
History     CSN: [id number]  Arrival date and time: 03/01/15 2355   First Provider Initiated Contact with Patient 03/02/15 0049      Chief Complaint  Patient presents with  . Abdominal Pain   Abdominal Pain This is a new problem. The current episode started today. The onset quality is gradual. The problem occurs intermittently. The problem has been unchanged. The pain is located in the LLQ. The pain is at a severity of 10/10. The quality of the pain is sharp. The abdominal pain does not radiate. Pertinent negatives include no constipation (last BM was today ), diarrhea, dysuria, fever, frequency, nausea or vomiting. Nothing aggravates the pain. The pain is relieved by nothing. She has tried nothing for the symptoms.    Past Medical History  Diagnosis Date  . DVT (deep venous thrombosis) (HCC) 07/2010    right leg  . Headache disorder   . Osteoporosis     LEFT HIP  . Contraceptive management     Past Surgical History  Procedure Laterality Date  . No past surgeries      Family History  Problem Relation Age of Onset  . Hypertension Father   . Hypertension Maternal Aunt   . Hypertension Paternal Uncle   . Hypertension Maternal Grandmother   . Hypertension Maternal Grandfather   . Hypertension Paternal Grandmother     Social History  Substance Use Topics  . Smoking status: Never Smoker   . Smokeless tobacco: Never Used  . Alcohol Use: No    Allergies:  Allergies  Allergen Reactions  . Latex Itching    Prescriptions prior to admission  Medication Sig Dispense Refill Last Dose  . ibuprofen (ADVIL,MOTRIN) 600 MG tablet Take 1 tablet (600 mg total) by mouth every 6 (six) hours as needed. (Patient not taking: Reported on 09/05/2014) 30 tablet 0 Completed Course at Unknown time  . metroNIDAZOLE (FLAGYL) 500 MG tablet Take 1 tablet (500 mg total) by mouth 2 (two) times daily. 14 tablet 0   . norethindrone (MICRONOR,CAMILA,ERRIN) 0.35 MG tablet Take 1 tablet (0.35 mg  total) by mouth daily. 1 Package 3     Review of Systems  Constitutional: Negative for fever and chills.  Gastrointestinal: Positive for abdominal pain. Negative for nausea, vomiting, diarrhea and constipation (last BM was today ).  Genitourinary: Negative for dysuria, urgency and frequency.   Physical Exam   Blood pressure 135/86, pulse 80, temperature 98.1 F (36.7 C), temperature source Oral, resp. rate 20, height  (1.626 m), weight 92.534 kg (204 lb), last menstrual period 02/28/2015.  Physical Exam  Nursing note and vitals reviewed. Constitutional: She is oriented to person, place, and time. She appears well-developed. No distress.  Cardiovascular: Normal rate.   Respiratory: Effort normal.  GI: Soft. There is no tenderness. There is no rebound.  Neurological: She is alert and oriented to person, place, and time.  Skin: Skin is warm and dry.  Psychiatric: She has a normal mood and affect.   Results for orders placed or performed during the hospital encounter of 03/01/15 (from the past 24 hour(s))  Urinalysis, Routine w reflex microscopic (not at [facility])     Status: Abnormal   Collection Time: 03/02/15 12:14 AM  Result Value Ref Range   Color, Urine YELLOW YELLOW   APPearance HAZY (A) CLEAR   Specific Gravity, Urine 1.010 1.005 - 1.030   pH 7.0 5.0 - 8.0   Glucose, UA NEGATIVE NEGATIVE mg/dL   Hgb urine dipstick LARGE (A)  NEGATIVE   Bilirubin Urine NEGATIVE NEGATIVE   Ketones, ur NEGATIVE NEGATIVE mg/dL   Protein, ur NEGATIVE NEGATIVE mg/dL   Nitrite NEGATIVE NEGATIVE   Leukocytes, UA NEGATIVE NEGATIVE  Urine microscopic-add on     Status: Abnormal   Collection Time: 03/02/15 12:14 AM  Result Value Ref Range   Squamous Epithelial / LPF 0-5 (A) NONE SEEN   WBC, UA 0-5 0 - 5 WBC/hpf   RBC / HPF 6-30 0 - 5 RBC/hpf   Bacteria, UA RARE (A) NONE SEEN  Pregnancy, urine POC     Status: None   Collection Time: 03/02/15 12:21 AM  Result Value Ref Range   Preg Test, Ur  NEGATIVE NEGATIVE  CBC     Status: Abnormal   Collection Time: 03/02/15 12:43 AM  Result Value Ref Range   WBC 11.0 (H) 4.0 - 10.5 K/uL   RBC 4.41 3.87 - 5.11 MIL/uL   Hemoglobin 13.3 12.0 - 15.0 g/dL   HCT 16.1 09.6 - 04.5 %   MCV 88.7 78.0 - 100.0 fL   MCH 30.2 26.0 - 34.0 pg   MCHC 34.0 30.0 - 36.0 g/dL   RDW 40.9 81.1 - 91.4 %   Platelets 274 150 - 400 K/uL  Wet prep, genital     Status: Abnormal   Collection Time: 03/02/15 12:58 AM  Result Value Ref Range   Yeast Wet Prep HPF POC NONE SEEN NONE SEEN   Trich, Wet Prep NONE SEEN NONE SEEN   Clue Cells Wet Prep HPF POC PRESENT (A) NONE SEEN   WBC, Wet Prep HPF POC NONE SEEN NONE SEEN   Sperm NONE SEEN     MAU Course  Procedures  MDM   Assessment and Plan   1. Bacterial vaginal infection   2. LLQ pain    DC home Comfort measures reviewed  RX: flagyl  BID x 7 days    Follow-up Information    Follow up with [facility].   Why:  If symptoms worsen   Contact information:   [address] [place] [address] [phone]         Tawnya Crook 03/02/2015, 12:52 AM

## 2015-03-02 NOTE — Discharge Instructions (Signed)

## 2015-03-02 NOTE — MAU Note (Signed)
PT  SAYS LMP-  2-22    SAYS  SHE STARTED HAVING  LEFT  SIDED PAIN  YESTERDAY-  NEVER HAPPENED  BEFORE.    NO BIRTH CONTROL.   LAST SEX-   SAT.     NO DR.      NO N/V/D.     NOW SAYS HAD PAIN LAST WEEK  WITH NAUSEA-  BUT  NOT  BAD.

## 2015-03-05 LAB — GC/CHLAMYDIA PROBE AMP (~~LOC~~) NOT AT ARMC
Chlamydia: NEGATIVE
NEISSERIA GONORRHEA: NEGATIVE

## 2015-03-08 ENCOUNTER — Ambulatory Visit: Payer: Managed Care, Other (non HMO) | Admitting: Family Medicine

## 2015-04-02 ENCOUNTER — Inpatient Hospital Stay (HOSPITAL_COMMUNITY)
Admission: AD | Admit: 2015-04-02 | Discharge: 2015-04-02 | Disposition: A | Discharge status: Home / Self Care | Payer: Managed Care, Other (non HMO) | Source: Ambulatory Visit | Attending: Family Medicine | Admitting: Family Medicine

## 2015-04-02 ENCOUNTER — Encounter (HOSPITAL_COMMUNITY): Payer: Self-pay | Admitting: *Deleted

## 2015-04-02 ENCOUNTER — Inpatient Hospital Stay (HOSPITAL_COMMUNITY): Payer: Managed Care, Other (non HMO)

## 2015-04-02 DIAGNOSIS — R109 Unspecified abdominal pain: Secondary | ICD-10-CM | POA: Insufficient documentation

## 2015-04-02 DIAGNOSIS — R51 Headache: Secondary | ICD-10-CM | POA: Insufficient documentation

## 2015-04-02 DIAGNOSIS — Z3A01 Less than 8 weeks gestation of pregnancy: Secondary | ICD-10-CM | POA: Insufficient documentation

## 2015-04-02 DIAGNOSIS — M810 Age-related osteoporosis without current pathological fracture: Secondary | ICD-10-CM | POA: Insufficient documentation

## 2015-04-02 DIAGNOSIS — O26899 Other specified pregnancy related conditions, unspecified trimester: Secondary | ICD-10-CM

## 2015-04-02 DIAGNOSIS — O26891 Other specified pregnancy related conditions, first trimester: Secondary | ICD-10-CM | POA: Diagnosis not present

## 2015-04-02 DIAGNOSIS — O9989 Other specified diseases and conditions complicating pregnancy, childbirth and the puerperium: Secondary | ICD-10-CM | POA: Diagnosis not present

## 2015-04-02 LAB — CBC
HCT: 37.6 % (ref 36.0–46.0)
Hemoglobin: 12.9 g/dL (ref 12.0–15.0)
MCH: 30.1 pg (ref 26.0–34.0)
MCHC: 34.3 g/dL (ref 30.0–36.0)
MCV: 87.9 fL (ref 78.0–100.0)
PLATELETS: 292 10*3/uL (ref 150–400)
RBC: 4.28 MIL/uL (ref 3.87–5.11)
RDW: 13.5 % (ref 11.5–15.5)
WBC: 12.8 10*3/uL — ABNORMAL HIGH (ref 4.0–10.5)

## 2015-04-02 LAB — URINALYSIS, ROUTINE W REFLEX MICROSCOPIC
BILIRUBIN URINE: NEGATIVE
Glucose, UA: NEGATIVE mg/dL
Ketones, ur: NEGATIVE mg/dL
Leukocytes, UA: NEGATIVE
Nitrite: NEGATIVE
PROTEIN: NEGATIVE mg/dL
Specific Gravity, Urine: >1.03 — ABNORMAL HIGH (ref 1.005–1.030)
pH: 5.5 (ref 5.0–8.0)

## 2015-04-02 LAB — WET PREP, GENITAL
Clue Cells Wet Prep HPF POC: NONE SEEN
Sperm: NONE SEEN
Trich, Wet Prep: NONE SEEN
YEAST WET PREP: NONE SEEN

## 2015-04-02 LAB — URINE MICROSCOPIC-ADD ON
BACTERIA UA: NONE SEEN
WBC, UA: NONE SEEN WBC/hpf (ref 0–5)

## 2015-04-02 LAB — POCT PREGNANCY, URINE: PREG TEST UR: POSITIVE — AB

## 2015-04-02 LAB — HCG, QUANTITATIVE, PREGNANCY: hCG, Beta Chain, Quant, S: 11467 m[IU]/mL — ABNORMAL HIGH (ref <5)

## 2015-04-02 NOTE — MAU Provider Note (Signed)
History     CSN: [id number]  Arrival date and time: 04/02/15 1442   First Provider Initiated Contact with Patient 04/02/15 1606      Chief Complaint  Patient presents with  . Flank Pain   HPI   [name] is a 40 y.o. female [phone] at [redacted]w[redacted]d presents to MAU with abdominal pain; the pain is located in the lower part of her stomach; both sides. The pain comes and goes. She has not taken anything for the pain.     OB History    Gravida Para Term Preterm AB TAB SAB Ectopic Multiple Living   Past Medical History  Diagnosis Date  . DVT (deep venous thrombosis) (HCC) 07/2010    right leg  . Headache disorder   . Osteoporosis     LEFT HIP  . Contraceptive management     Past Surgical History  Procedure Laterality Date  . No past surgeries      Family History  Problem Relation Age of Onset  . Hypertension Father   . Hypertension Maternal Aunt   . Hypertension Paternal Uncle   . Hypertension Maternal Grandmother   . Hypertension Maternal Grandfather   . Hypertension Paternal Grandmother     Social History  Substance Use Topics  . Smoking status: Never Smoker   . Smokeless tobacco: Never Used  . Alcohol Use: No    Allergies:  Allergies  Allergen Reactions  . Latex Itching    Prescriptions prior to admission  Medication Sig Dispense Refill Last Dose  . ibuprofen (ADVIL,MOTRIN) 600 MG tablet Take 1 tablet (600 mg total) by mouth every 6 (six) hours as needed. (Patient not taking: Reported on 09/05/2014) 30 tablet 0 Completed Course at Unknown time  . metroNIDAZOLE (FLAGYL) 500 MG tablet Take 1 tablet (500 mg total) by mouth 2 (two) times daily. 14 tablet 0   . norethindrone (MICRONOR,CAMILA,ERRIN) 0.35 MG tablet Take 1 tablet (0.35 mg total) by mouth daily. 1 Package 3    Results for orders placed or performed during the hospital encounter of 04/02/15 (from the past 48 hour(s))  Urinalysis, Routine w reflex microscopic (not at [facility])      Status: Abnormal   Collection Time: 04/02/15  3:30 PM  Result Value Ref Range   Color, Urine YELLOW YELLOW   APPearance CLEAR CLEAR   Specific Gravity, Urine >1.030 (H) 1.005 - 1.030   pH 5.5 5.0 - 8.0   Glucose, UA NEGATIVE NEGATIVE mg/dL   Hgb urine dipstick SMALL (A) NEGATIVE   Bilirubin Urine NEGATIVE NEGATIVE   Ketones, ur NEGATIVE NEGATIVE mg/dL   Protein, ur NEGATIVE NEGATIVE mg/dL   Nitrite NEGATIVE NEGATIVE   Leukocytes, UA NEGATIVE NEGATIVE  Urine microscopic-add on     Status: Abnormal   Collection Time: 04/02/15  3:30 PM  Result Value Ref Range   Squamous Epithelial / LPF 0-5 (A) NONE SEEN   WBC, UA NONE SEEN 0 - 5 WBC/hpf   RBC / HPF 0-5 0 - 5 RBC/hpf   Bacteria, UA NONE SEEN NONE SEEN   Urine-Other MUCOUS PRESENT   Pregnancy, urine POC     Status: Abnormal   Collection Time: 04/02/15  3:36 PM  Result Value Ref Range   Preg Test, Ur POSITIVE (A) NEGATIVE    Comment:        THE SENSITIVITY OF THIS METHODOLOGY IS >24 mIU/mL   hCG, quantitative, pregnancy  Status: Abnormal   Collection Time: 04/02/15  3:54 PM  Result Value Ref Range   hCG, Beta Chain, Quant, S 11467 (H) <5 mIU/mL    Comment:          GEST. AGE      CONC.  (mIU/mL)   <=1 WEEK        5 - 50     2 WEEKS       50 - 500     3 WEEKS       100 - 10,000     4 WEEKS     1,000 - 30,000     5 WEEKS     3,500 - 115,000   6-8 WEEKS     12,000 - 270,000    12 WEEKS     15,000 - 220,000        FEMALE AND NON-PREGNANT FEMALE:     LESS THAN 5 mIU/mL   CBC     Status: Abnormal   Collection Time: 04/02/15  3:54 PM  Result Value Ref Range   WBC 12.8 (H) 4.0 - 10.5 K/uL   RBC 4.28 3.87 - 5.11 MIL/uL   Hemoglobin 12.9 12.0 - 15.0 g/dL   HCT 84.137.6 32.436.0 - 40.146.0 %   MCV 87.9 78.0 - 100.0 fL   MCH 30.1 26.0 - 34.0 pg   MCHC 34.3 30.0 - 36.0 g/dL   RDW 02.713.5 25.311.5 - 66.415.5 %   Platelets 292 150 - 400 K/uL  Wet prep, genital     Status: Abnormal   Collection Time: 04/02/15  4:25 PM  Result Value Ref Range    Yeast Wet Prep HPF POC NONE SEEN NONE SEEN   Trich, Wet Prep NONE SEEN NONE SEEN   Clue Cells Wet Prep HPF POC NONE SEEN NONE SEEN   WBC, Wet Prep HPF POC MANY (A) NONE SEEN    Comment: MANY BACTERIA SEEN   Sperm NONE SEEN     [place] Ob Comp Less 14 Wks  04/02/2015  CLINICAL DATA:  Abdominal pain and pregnancy. EXAM: OBSTETRIC <14 WK US AND TRANSVAGINAL OB US TECHNIQUE: Both transabdominal and transvaginal ultrasound examinations were performed for complete evaluation of the gestation as well as the maternal uterus, adnexal regions, and pelvic cul-de-sac. Transvaginal technique was performed to assess early pregnancy. COMPARISON:  None. FINDINGS: Intrauterine gestational sac: Visualized/normal in shape. Yolk sac:  Present Embryo:  Not presents Cardiac Activity: Not present MSD: 9.4  Mm   5 w   5 d Subchorionic hemorrhage:  Small subchorionic hemorrhage. Maternal uterus/adnexae: Normal bilateral ovaries. No adnexal mass. No pelvic free fluid. IMPRESSION: 1. Probable early intrauterine gestational sac, but fetal pole or cardiac activity yet visualized. Recommend follow-up quantitative B-HCG levels and follow-up US in 14 days to confirm and assess viability. This recommendation follows SRU consensus guidelines: Diagnostic Criteria for Nonviable Pregnancy Early in the First Trimester. Malva Limes Engl J Med 2013; 403:4742-59; 369:1443-51. 2. Small subchorionic hemorrhage. Electronically Signed   By: Elige KoHetal  Patel   On: 04/02/2015 17:02   [place] Ob Transvaginal  04/02/2015  CLINICAL DATA:  Abdominal pain and pregnancy. EXAM: OBSTETRIC <14 WK US AND TRANSVAGINAL OB US TECHNIQUE: Both transabdominal and transvaginal ultrasound examinations were performed for complete evaluation of the gestation as well as the maternal uterus, adnexal regions, and pelvic cul-de-sac. Transvaginal technique was performed to assess early pregnancy. COMPARISON:  None. FINDINGS: Intrauterine gestational sac: Visualized/normal in shape. Yolk sac:  Present Embryo:   Not presents Cardiac Activity: Not  present MSD: 9.4  Mm   5 w   5 d Subchorionic hemorrhage:  Small subchorionic hemorrhage. Maternal uterus/adnexae: Normal bilateral ovaries. No adnexal mass. No pelvic free fluid. IMPRESSION: 1. Probable early intrauterine gestational sac, but fetal pole or cardiac activity yet visualized. Recommend follow-up quantitative B-HCG levels and follow-up US in 14 days to confirm and assess viability. This recommendation follows SRU consensus guidelines: Diagnostic Criteria for Nonviable Pregnancy Early in the First Trimester. Malva Limes Med 2013; 696:2952-84. 2. Small subchorionic hemorrhage. Electronically Signed   By: Elige Ko   On: 04/02/2015 17:02    Review of Systems  Constitutional: Negative for fever.  Gastrointestinal: Positive for abdominal pain. Negative for nausea and vomiting.  Genitourinary: Negative for dysuria and urgency.       Denies abnormal discharge, denies vaginal bleeding.    Physical Exam   Blood pressure 125/85, pulse 74, temperature 98.3 F (36.8 C), temperature source Oral, resp. rate 16, height  (1.676 m), weight 205 lb (92.987 kg), last menstrual period 02/28/2015.  Physical Exam  Constitutional: She is oriented to person, place, and time. She appears well-developed and well-nourished.  HENT:  Head: Normocephalic.  Eyes: Pupils are equal, round, and reactive to light.  GI: There is tenderness in the suprapubic area. There is no rigidity, no rebound and no guarding.  Genitourinary:  Speculum exam: Vagina - Small amount of creamy discharge, no odor Cervix - No contact bleeding Bimanual exam: Cervix closed Uterus non tender, normal size Adnexa non tender, no masses bilaterally GC/Chlam, wet prep done Chaperone present for exam.  Musculoskeletal: Normal range of motion.  Neurological: She is alert and oriented to person, place, and time.  Skin: Skin is warm.  Psychiatric: Her behavior is normal.    MAU Course  Procedures   None  MDM  Patient upset at the time of discharge. Says she was told by radiology that MAU staff would give her an [place] picture. I explained to the patient that [place] photo's at this gestation are difficult to interpret and usually at this early gestation photo's are not printer. I also informed her that MAU staff does not have the ability to print [place] photo's. Patient aware and ok with explanation.   Assessment and Plan   A:  1. Abdominal pain in pregnancy, antepartum   2. Abdominal pain in pregnancy, antepartum    P:  Discharge home in stable condition Return to MAU as needed, if symptoms worsen Start prenatal care First trimester warning signs.    Duane Lope, NP 04/02/2015 4:16 PM

## 2015-04-02 NOTE — MAU Note (Signed)
Patient presents with +HPT X 3 c/o left sided pain today. Denies bleeding or discharge.

## 2015-04-02 NOTE — Discharge Instructions (Signed)

## 2015-04-03 LAB — GC/CHLAMYDIA PROBE AMP (~~LOC~~) NOT AT ARMC
Chlamydia: NEGATIVE
Neisseria Gonorrhea: NEGATIVE

## 2015-04-05 ENCOUNTER — Ambulatory Visit: Payer: Managed Care, Other (non HMO) | Admitting: Certified Nurse Midwife

## 2015-06-13 ENCOUNTER — Encounter (HOSPITAL_COMMUNITY): Payer: Self-pay

## 2015-06-13 ENCOUNTER — Inpatient Hospital Stay (HOSPITAL_COMMUNITY)
Admission: AD | Admit: 2015-06-13 | Discharge: 2015-06-13 | Disposition: A | Discharge status: Home / Self Care | Payer: Medicaid Other | Source: Ambulatory Visit | Attending: Obstetrics | Admitting: Obstetrics

## 2015-06-13 DIAGNOSIS — R102 Pelvic and perineal pain: Secondary | ICD-10-CM | POA: Diagnosis not present

## 2015-06-13 DIAGNOSIS — Z3A15 15 weeks gestation of pregnancy: Secondary | ICD-10-CM | POA: Diagnosis not present

## 2015-06-13 DIAGNOSIS — O23592 Infection of other part of genital tract in pregnancy, second trimester: Secondary | ICD-10-CM

## 2015-06-13 DIAGNOSIS — Z760 Encounter for issue of repeat prescription: Secondary | ICD-10-CM

## 2015-06-13 DIAGNOSIS — Z86718 Personal history of other venous thrombosis and embolism: Secondary | ICD-10-CM | POA: Insufficient documentation

## 2015-06-13 DIAGNOSIS — N760 Acute vaginitis: Secondary | ICD-10-CM | POA: Diagnosis not present

## 2015-06-13 DIAGNOSIS — B9689 Other specified bacterial agents as the cause of diseases classified elsewhere: Secondary | ICD-10-CM

## 2015-06-13 DIAGNOSIS — N949 Unspecified condition associated with female genital organs and menstrual cycle: Secondary | ICD-10-CM

## 2015-06-13 LAB — URINALYSIS, ROUTINE W REFLEX MICROSCOPIC
Bilirubin Urine: NEGATIVE
Glucose, UA: NEGATIVE mg/dL
Hgb urine dipstick: NEGATIVE
Ketones, ur: NEGATIVE mg/dL
LEUKOCYTES UA: NEGATIVE
NITRITE: NEGATIVE
PH: 6.5 (ref 5.0–8.0)
Protein, ur: NEGATIVE mg/dL
SPECIFIC GRAVITY, URINE: 1.02 (ref 1.005–1.030)

## 2015-06-13 LAB — WET PREP, GENITAL
CLUE CELLS WET PREP: NONE SEEN
SPERM: NONE SEEN
TRICH WET PREP: NONE SEEN
Yeast Wet Prep HPF POC: NONE SEEN

## 2015-06-13 MED ORDER — METRONIDAZOLE 0.75 % VA GEL: 1.0000 | Freq: Every day | VAGINAL | Status: DC

## 2015-06-13 MED ORDER — METRONIDAZOLE 500 MG PO TABS: 500.0000 mg | ORAL_TABLET | Freq: Two times a day (BID) | ORAL | Status: DC

## 2015-06-13 NOTE — Discharge Instructions (Signed)
Bacterial Vaginosis °Bacterial vaginosis is a vaginal infection that occurs when the normal balance of bacteria in the vagina is disrupted. It results from an overgrowth of certain bacteria. This is the most common vaginal infection in women of childbearing age. Treatment is important to prevent complications, especially in pregnant women, as it can cause a premature delivery. °CAUSES  °Bacterial vaginosis is caused by an increase in harmful bacteria that are normally present in smaller amounts in the vagina. Several different kinds of bacteria can cause bacterial vaginosis. However, the reason that the condition develops is not fully understood. °RISK FACTORS °Certain activities or behaviors can put you at an increased risk of developing bacterial vaginosis, including: °· Having a new sex partner or multiple sex partners. °· Douching. °· Using an intrauterine device (IUD) for contraception. °Women do not get bacterial vaginosis from toilet seats, bedding, swimming pools, or contact with objects around them. °SIGNS AND SYMPTOMS  °Some women with bacterial vaginosis have no signs or symptoms. Common symptoms include: °· Grey vaginal discharge. °· A fishlike odor with discharge, especially after sexual intercourse. °· Itching or burning of the vagina and vulva. °· Burning or pain with urination. °DIAGNOSIS  °Your health care provider will take a medical history and examine the vagina for signs of bacterial vaginosis. A sample of vaginal fluid may be taken. Your health care provider will look at this sample under a microscope to check for bacteria and abnormal cells. A vaginal pH test may also be done.  °TREATMENT  °Bacterial vaginosis may be treated with antibiotic medicines. These may be given in the form of a pill or a vaginal cream. A second round of antibiotics may be prescribed if the condition comes back after treatment. Because bacterial vaginosis increases your risk for sexually transmitted diseases, getting  treated can help reduce your risk for chlamydia, gonorrhea, HIV, and herpes. °HOME CARE INSTRUCTIONS  °· Only take over-the-counter or prescription medicines as directed by your health care provider. °· If antibiotic medicine was prescribed, take it as directed. Make sure you finish it even if you start to feel better. °· Tell all sexual partners that you have a vaginal infection. They should see their health care provider and be treated if they have problems, such as a mild rash or itching. °· During treatment, it is important that you follow these instructions: °¨ Avoid sexual activity or use condoms correctly. °¨ Do not douche. °¨ Avoid alcohol as directed by your health care provider. °¨ Avoid breastfeeding as directed by your health care provider. °SEEK MEDICAL CARE IF:  °· Your symptoms are not improving after 3 days of treatment. °· You have increased discharge or pain. °· You have a fever. °MAKE SURE YOU:  °· Understand these instructions. °· Will watch your condition. °· Will get help right away if you are not doing well or get worse. °FOR MORE INFORMATION  °Centers for Disease Control and Prevention, Division of STD Prevention: www.cdc.gov/std °American Sexual Health Association (ASHA): www.ashastd.org  °  °This information is not intended to replace advice given to you by your health care provider. Make sure you discuss any questions you have with your health care provider. °  °Document Released: 12/23/2004 Document Revised: 01/13/2014 Document Reviewed: 08/04/2012 °Elsevier Interactive Patient Education ©2016 Elsevier Inc. ° ° °Round Ligament Pain during Pregnancy °Many women will experience a type of pain referred to as “round ligament pain” during their °pregnancy. This is associated with abdominal pain or discomfort. Since any type of abdominal °  pain during pregnancy can be disconcerting, it is important to talk about round ligament pain to °relieve any anxiety or fears you may have regarding the  symptoms you are feeling. Round °ligament pain is due to normal changes that take place in the body during pregnancy. It is caused °by stretching of the round ligaments attached to the uterus. More commonly it occurs on the right °side of the pelvis. °Round Ligament: An Overview °Typically in the non-pregnant state the uterus is about the size of an apple or pear. There are °thick ligaments which hold the uterus in place in the abdomen, referred to as round ligaments. °During pregnancy, your uterus will expand in size and weight, and the ligaments supporting it will °have to stretch, becoming longer and thinner. As these ligaments pull and tug they may irritate °nearby nerve fibers, which causes pain. The severity of the pain in some cases can seem °extreme. °Some common symptoms of round ligament pain include: °• Ligament spasms or contractions/cramps that trigger a sharp pain typically on the right °side of the abdomen. °• Pain upon waking or suddenly rolling over in your sleep. °• Pain in the abdomen that is sharp brought on by exercise or other vigorous activity. °Similar Problems °Round ligament pain is often mistaken for other medical conditions because the symptoms are °similar. Acute abdominal pain during pregnancy may also be a sign of other conditions including: °• Abdominal cramps - Some abdominal pain is simply caused by change in bowel habits °associated with pregnancy. Gas is a common problem that can cause sharp, shooting °pain. °You should always seek out medical care if your pain is accompanied by fever, chills, pain upon °urination or if you have difficulty walking. Further exams and tests will be conducted to ensure °that you do not have a more serious condition. It is not uncommon for women with lower °abdominal pain to have a urinary tract infection, thus you may also be asked for a urine sample. °Treatment °If all other conditions are ruled out you can treat your round ligament pain relatively  easily. You °may be advised to take some acetaminophen (Tylenol) to reduce the severity of any persistent °pain and asked to reduce your activity level. You can apply a heating pad to the area of pain or °take a warm bath. Lying on the opposite side of the pain may help as well. °Most women will find relief from round ligament pain simply by altering their daily routines slightly. °The good news is round ligament pain will disappear completely once you have given birth to °your child! ° °

## 2015-06-13 NOTE — MAU Note (Signed)
Patient presents with c/o of lower abdominal pain that started an hour ago. Patient also states that has felt some sharp pains on her left side since lastnight.

## 2015-06-13 NOTE — MAU Provider Note (Signed)
History     CSN: [id number]  Arrival date and time: 06/13/15 2020   First Provider Initiated Contact with Patient 06/13/15 2047      Chief Complaint  Patient presents with  . Abdominal Pain   HPI Comments: Carmen Lewis is a 40 y.o. [id number] at 6970w0d who presents today with bilateral lower abdominal pain. She denies any VB. She states that she thinks she may have BV. She reports increased vaginal discharge with odor.   Abdominal Pain This is a new problem. The current episode started today. The onset quality is sudden. The problem occurs intermittently. The problem has been unchanged. The pain is located in the suprapubic region. The pain is at a severity of 7/10. The quality of the pain is sharp. The abdominal pain does not radiate. Pertinent negatives include no constipation, diarrhea, dysuria, fever, frequency, nausea or vomiting. The pain is aggravated by movement (walking or sudden movements ). The pain is relieved by nothing. She has tried nothing for the symptoms.     Past Medical History  Diagnosis Date  . DVT (deep venous thrombosis) (HCC) 07/2010    right leg  . Headache disorder   . Osteoporosis     LEFT HIP  . Contraceptive management     Past Surgical History  Procedure Laterality Date  . No past surgeries      Family History  Problem Relation Age of Onset  . Hypertension Father   . Hypertension Maternal Aunt   . Hypertension Paternal Uncle   . Hypertension Maternal Grandmother   . Hypertension Maternal Grandfather   . Hypertension Paternal Grandmother     Social History  Substance Use Topics  . Smoking status: Never Smoker   . Smokeless tobacco: Never Used  . Alcohol Use: No    Allergies:  Allergies  Allergen Reactions  . Latex Itching    Prescriptions prior to admission  Medication Sig Dispense Refill Last Dose  . enoxaparin (LOVENOX) 40 MG/0.4ML injection Inject 40 mg into the skin daily.   06/12/2015 at 2100    Review of Systems   Constitutional: Negative for fever and chills.  Gastrointestinal: Positive for abdominal pain. Negative for nausea, vomiting, diarrhea and constipation.  Genitourinary: Negative for dysuria, urgency and frequency.   Physical Exam   Temperature 98.2 F (36.8 C), resp. rate 18, last menstrual period 02/28/2015.  Physical Exam  Nursing note and vitals reviewed. Constitutional: She is oriented to person, place, and time. She appears well-developed and well-nourished. No distress.  HENT:  Head: Normocephalic.  Cardiovascular: Normal rate.   Respiratory: Effort normal.  GI: Soft. There is no tenderness. There is no rebound.  Genitourinary:   External: no lesion Vagina: moderate amount of thin, white, homogenous discharge Cervix: pink, smooth, no CMT Uterus: AGA, FHT 151 with doppler    Neurological: She is alert and oriented to person, place, and time.  Skin: Skin is warm and dry.  Psychiatric: She has a normal mood and affect.   Results for orders placed or performed during the hospital encounter of 06/13/15 (from the past 24 hour(s))  Urinalysis, Routine w reflex microscopic (not at [facility])     Status: None   Collection Time: 06/13/15  8:28 PM  Result Value Ref Range   Color, Urine YELLOW YELLOW   APPearance CLEAR CLEAR   Specific Gravity, Urine 1.020 1.005 - 1.030   pH 6.5 5.0 - 8.0   Glucose, UA NEGATIVE NEGATIVE mg/dL   Hgb urine dipstick NEGATIVE NEGATIVE  Bilirubin Urine NEGATIVE NEGATIVE   Ketones, ur NEGATIVE NEGATIVE mg/dL   Protein, ur NEGATIVE NEGATIVE mg/dL   Nitrite NEGATIVE NEGATIVE   Leukocytes, UA NEGATIVE NEGATIVE  Wet prep, genital     Status: Abnormal   Collection Time: 06/13/15  8:56 PM  Result Value Ref Range   Yeast Wet Prep HPF POC NONE SEEN NONE SEEN   Trich, Wet Prep NONE SEEN NONE SEEN   Clue Cells Wet Prep HPF POC NONE SEEN NONE SEEN   WBC, Wet Prep HPF POC MANY (A) NONE SEEN   Sperm NONE SEEN     MAU Course   Procedures  MDM   Assessment and Plan   1. Round ligament pain   2. BV (bacterial vaginosis)    DC home Comfort measures reviewed  2nd Trimester precautions  PTL precautions  Fetal kick counts RX: metrogel as directed, patient initially given RX for flagyl tablets, but she states that she cannot tolerate taking the pills.  Return to MAU as needed FU with OB as planned  Follow-up Information    Follow up with [facility].   Why:  As scheduled   Contact information:   [address] [place] [address] [phone]        Tawnya Crook 06/13/2015, 8:49 PM

## 2015-06-14 LAB — GC/CHLAMYDIA PROBE AMP (~~LOC~~) NOT AT ARMC
Chlamydia: NEGATIVE
Neisseria Gonorrhea: NEGATIVE

## 2015-06-20 ENCOUNTER — Other Ambulatory Visit (HOSPITAL_COMMUNITY): Payer: Self-pay | Admitting: Obstetrics

## 2015-06-20 DIAGNOSIS — Z3689 Encounter for other specified antenatal screening: Secondary | ICD-10-CM

## 2015-06-20 DIAGNOSIS — Z3A18 18 weeks gestation of pregnancy: Secondary | ICD-10-CM

## 2015-06-20 DIAGNOSIS — O2230 Deep phlebothrombosis in pregnancy, unspecified trimester: Secondary | ICD-10-CM

## 2015-06-27 ENCOUNTER — Encounter (HOSPITAL_COMMUNITY): Payer: Self-pay | Admitting: Obstetrics

## 2015-07-05 ENCOUNTER — Encounter (HOSPITAL_COMMUNITY): Payer: Self-pay

## 2015-07-06 ENCOUNTER — Encounter (HOSPITAL_COMMUNITY): Payer: Self-pay

## 2015-07-06 ENCOUNTER — Ambulatory Visit (HOSPITAL_COMMUNITY): Payer: Managed Care, Other (non HMO)

## 2015-07-06 ENCOUNTER — Other Ambulatory Visit (HOSPITAL_COMMUNITY): Payer: Managed Care, Other (non HMO)

## 2015-07-06 ENCOUNTER — Ambulatory Visit (HOSPITAL_COMMUNITY)
Admission: RE | Admit: 2015-07-06 | Discharge: 2015-07-06 | Disposition: A | Discharge status: Home / Self Care | Payer: Medicaid Other | Source: Ambulatory Visit | Attending: Obstetrics | Admitting: Obstetrics

## 2015-07-06 DIAGNOSIS — O2232 Deep phlebothrombosis in pregnancy, second trimester: Secondary | ICD-10-CM | POA: Insufficient documentation

## 2015-07-06 DIAGNOSIS — Z3A18 18 weeks gestation of pregnancy: Secondary | ICD-10-CM | POA: Insufficient documentation

## 2015-07-06 DIAGNOSIS — O09529 Supervision of elderly multigravida, unspecified trimester: Secondary | ICD-10-CM | POA: Insufficient documentation

## 2015-07-06 DIAGNOSIS — O2230 Deep phlebothrombosis in pregnancy, unspecified trimester: Secondary | ICD-10-CM

## 2015-07-06 DIAGNOSIS — Z3689 Encounter for other specified antenatal screening: Secondary | ICD-10-CM

## 2015-07-06 DIAGNOSIS — Z36 Encounter for antenatal screening of mother: Secondary | ICD-10-CM | POA: Diagnosis present

## 2015-07-06 DIAGNOSIS — O09522 Supervision of elderly multigravida, second trimester: Secondary | ICD-10-CM | POA: Insufficient documentation

## 2015-07-06 NOTE — Progress Notes (Signed)
Genetic Counseling  High-Risk Gestation Note  Appointment Date:  07/06/2015 Referred By: Kathreen CosierMarshall, Bernard A, MD Date of Birth:  [birth year]   Pregnancy History: [id number] Estimated Date of Delivery: 12/01/15 Estimated Gestational Age: 2258w6d Attending: Particia NearingMartha Decker, MD  Ms. Lowell GuitarAlicia J Lewis and her partner were seen for genetic counseling because of a maternal age of 40 y.o..     In summary:  Discussed AMA and associated risk for fetal aneuploidy  Detailed ultrasound performed today  Echogenic intracardiac focus (EIF) visualized, adjusted risk for fetal Down syndrome approximately 1 in 48 (2%)  Remaining visualized fetal anatomy within normal limits  Discussed options for screening  Quad screen-declined  NIPS-declined  Ultrasound  Discussed diagnostic testing options  Amniocentesis-declined  Reviewed family history concerns  Discussed carrier screening options (CF, SMA, Hemoglobinopathies)- patient declined  They were counseled regarding maternal age and the association with risk for chromosome conditions due to nondisjunction with aging of the ova.   We reviewed chromosomes, nondisjunction, and the associated 1 in 7556 risk for fetal aneuploidy related to a maternal age of 40 y.o. at 3558w6d gestation.  They were counseled that the risk for aneuploidy decreases as gestational age increases, accounting for those pregnancies which spontaneously abort.  We specifically discussed Down syndrome (trisomy 3921), trisomies 1813 and 618, and sex chromosome aneuploidies (47,XXX and 47,XXY) including the common features and prognoses of each.   We reviewed available screening options including Quad screen, noninvasive prenatal screening (NIPS)/cell free DNA (cfDNA) screening, and detailed ultrasound. They were counseled that screening tests are used to modify a patient's a priori risk for aneuploidy, typically based on age. This estimate provides a pregnancy specific risk assessment. We reviewed  the benefits and limitations of each option. Specifically, we discussed the conditions for which each test screens, the detection rates, and false positive rates of each. They were also counseled regarding diagnostic testing via amniocentesis. We reviewed the approximate 1 in 300-500 risk for complications from amniocentesis, including spontaneous pregnancy loss. We discussed the possible results that the tests might provide including: positive, negative, unanticipated, and no result. Finally, they were counseled regarding the cost of each option and potential out of pocket expenses.  Detailed ultrasound was performed today. Visualized fetal anatomy within normal limits. Echogenic intracardiac focus (EIF) was visualized at this time. Complete ultrasound results under separate cover. An isolated echogenic focus is generally believed to be a normal variation without any concerns for the pregnancy.  Isolated echogenic cardiac foci are not associated with congenital heart defects in the baby or compromised cardiac function after birth.  However, an echogenic cardiac focus is associated with a slightly increased chance for Down syndrome in the pregnancy. Thus, the presence of an EIF would increase the risk for Down syndrome above the patient's age related risk of 1 in 1996 to approximately 1 in 48 (2%).  After consideration of all the options, she declined Quad screen, NIPS, and amniocentesis. The couple stated they were comfortable with the risk assessment for chromosome conditions from the patient's age and today's ultrasound. They understand/s that screening tests cannot rule out all birth defects or genetic syndromes. The patient was advised of this limitation and states she still does not want additional testing at this time.   Ms. Lowell Guitarlicia J Tutton was provided with written information regarding cystic fibrosis (CF), spinal muscular atrophy (SMA) and hemoglobinopathies including the carrier frequency,  availability of carrier screening and prenatal diagnosis if indicated.  In addition, we discussed that CF and hemoglobinopathies  are routinely screened for as part of the [place] newborn screening panel.  After further discussion, she declined screening for CF, SMA and hemoglobinopathies.  Both family histories were reviewed and found to be noncontributory for birth defects, intellectual disability, and known genetic conditions. Without further information regarding the provided family history, an accurate genetic risk cannot be calculated. Further genetic counseling is warranted if more information is obtained.  Ms. Lowell Guitarlicia J Estorga denied exposure to environmental toxins or chemical agents. She denied the use of alcohol, tobacco or street drugs. She denied significant viral illnesses during the course of her pregnancy. Her medical and surgical histories were contributory for history of deep vein thrombosis; she is currently treated with lovenox in pregnancy.   I counseled this couple regarding the above risks and available options.  The approximate face-to-face time with the genetic counselor was 40 minutes.  Quinn PlowmanKaren Abriella Filkins, MS,  Certified Genetic Counselor 07/06/2015

## 2015-07-12 ENCOUNTER — Ambulatory Visit (HOSPITAL_COMMUNITY): Payer: Managed Care, Other (non HMO)

## 2015-07-19 ENCOUNTER — Ambulatory Visit (INDEPENDENT_AMBULATORY_CARE_PROVIDER_SITE_OTHER): Payer: Medicaid Other | Admitting: Certified Nurse Midwife

## 2015-07-19 ENCOUNTER — Encounter: Payer: Self-pay | Admitting: Certified Nurse Midwife

## 2015-07-19 ENCOUNTER — Encounter: Payer: Self-pay | Admitting: *Deleted

## 2015-07-19 VITALS — BP 127/79 | HR 73 | Temp 98.1°F | Wt 207.6 lb

## 2015-07-19 DIAGNOSIS — Z86718 Personal history of other venous thrombosis and embolism: Secondary | ICD-10-CM

## 2015-07-19 DIAGNOSIS — O09522 Supervision of elderly multigravida, second trimester: Secondary | ICD-10-CM

## 2015-07-19 DIAGNOSIS — R519 Headache, unspecified: Secondary | ICD-10-CM

## 2015-07-19 DIAGNOSIS — O26892 Other specified pregnancy related conditions, second trimester: Secondary | ICD-10-CM

## 2015-07-19 DIAGNOSIS — N760 Acute vaginitis: Secondary | ICD-10-CM

## 2015-07-19 DIAGNOSIS — O0992 Supervision of high risk pregnancy, unspecified, second trimester: Secondary | ICD-10-CM

## 2015-07-19 DIAGNOSIS — R51 Headache: Secondary | ICD-10-CM

## 2015-07-19 MED ORDER — TERCONAZOLE 0.8 % VA CREA: 1.0000 | TOPICAL_CREAM | Freq: Every day | VAGINAL | Status: DC

## 2015-07-19 MED ORDER — FLUCONAZOLE 100 MG PO TABS: 100.0000 mg | ORAL_TABLET | Freq: Once | ORAL | Status: DC

## 2015-07-19 MED ORDER — BUTALBITAL-APAP-CAFFEINE 50-325-40 MG PO TABS: 1.0000 | ORAL_TABLET | Freq: Four times a day (QID) | ORAL | Status: DC | PRN

## 2015-07-19 NOTE — Progress Notes (Signed)
Subjective:    Carmen Lewis is a 40 y.o. female being seen today for her obstetrical visit. She is at 1173w5d gestation. Patient reports: no bleeding, no contractions, no cramping, no leaking and migraine headaches . Fetal movement: normal.  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 Interested in tubal ligation, but open to IUD & depo.  Problem List Items Addressed This Visit      Other   Advanced maternal age in multigravida   Relevant Orders   TSH   HIV antibody   Hemoglobinopathy evaluation   Varicella zoster antibody, IgG   Culture, OB Urine   Prenatal Profile I   MaterniT Genome    Other Visit Diagnoses    Supervision of high-risk pregnancy, second trimester    -  Primary    Relevant Orders    AMB referral to maternal fetal medicine    TSH    HIV antibody    Hemoglobinopathy evaluation    Varicella zoster antibody, IgG    Culture, OB Urine    Prenatal Profile I    MaterniT Genome    History of DVT (deep vein thrombosis)        Relevant Orders    TSH    HIV antibody    Hemoglobinopathy evaluation    Varicella zoster antibody, IgG    Culture, OB Urine    Prenatal Profile I    MaterniT Genome    Heparin Anti-Xa    Headache in pregnancy, antepartum, second trimester        Relevant Medications    butalbital-acetaminophen-caffeine (FIORICET) 50-325-40 MG tablet    Vaginitis        Relevant Medications    fluconazole (DIFLUCAN) 100 MG tablet    terconazole (TERAZOL 3) 0.8 % vaginal cream      Patient Active Problem List   Diagnosis Date Noted  . Advanced maternal age in multigravida 07/06/2015  . Gonorrhea 02/06/2015  . 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 Long term (current) use of anticoagulants 10/25/2010   Objective:    BP 127/79 mmHg  Pulse 73  Temp(Src) 98.1 F (36.7 C)  Wt 207 lb 9.6 oz (94.167 kg)  LMP 02/28/2015 (Approximate) FHT: 140 BPM  Uterine Size: size equals dates     Assessment:    Pregnancy  @ 3173w5d   AMA. Migraine headaches.  Plan:    OBGCT: discussed. Signs and symptoms of preterm labor: discussed. Plans for breastfeeding.  Fiorcet for headaches. Depo after delivery & IUD 6 weeks after delivery. Labs, problem list reviewed and updated 2 hr GTT planned Follow up in 4 weeks.

## 2015-07-19 NOTE — Progress Notes (Signed)
I agree with note by NP Student Andrew Brake.  Was present for exam.  R.Meryle Pugmire CNM 

## 2015-07-19 NOTE — Progress Notes (Signed)
Patient is concerned about her level of care

## 2015-07-20 ENCOUNTER — Ambulatory Visit (HOSPITAL_COMMUNITY): Admission: RE | Admit: 2015-07-20 | Payer: Managed Care, Other (non HMO) | Source: Ambulatory Visit

## 2015-07-24 ENCOUNTER — Ambulatory Visit (HOSPITAL_COMMUNITY)
Admission: RE | Admit: 2015-07-24 | Discharge: 2015-07-24 | Disposition: A | Discharge status: Home / Self Care | Payer: Medicaid Other | Source: Ambulatory Visit | Attending: Certified Nurse Midwife | Admitting: Certified Nurse Midwife

## 2015-07-24 ENCOUNTER — Other Ambulatory Visit: Payer: Medicaid Other

## 2015-07-24 ENCOUNTER — Other Ambulatory Visit: Payer: Self-pay | Admitting: Certified Nurse Midwife

## 2015-07-24 ENCOUNTER — Encounter (HOSPITAL_COMMUNITY): Payer: Self-pay

## 2015-07-24 VITALS — BP 126/69 | HR 83 | Wt 209.0 lb

## 2015-07-24 DIAGNOSIS — O9989 Other specified diseases and conditions complicating pregnancy, childbirth and the puerperium: Secondary | ICD-10-CM | POA: Diagnosis present

## 2015-07-24 DIAGNOSIS — Z3A21 21 weeks gestation of pregnancy: Secondary | ICD-10-CM | POA: Insufficient documentation

## 2015-07-24 DIAGNOSIS — O09522 Supervision of elderly multigravida, second trimester: Secondary | ICD-10-CM | POA: Insufficient documentation

## 2015-07-24 DIAGNOSIS — Z86718 Personal history of other venous thrombosis and embolism: Secondary | ICD-10-CM | POA: Diagnosis not present

## 2015-07-24 DIAGNOSIS — M810 Age-related osteoporosis without current pathological fracture: Secondary | ICD-10-CM | POA: Insufficient documentation

## 2015-07-24 DIAGNOSIS — Z0189 Encounter for other specified special examinations: Secondary | ICD-10-CM

## 2015-07-24 DIAGNOSIS — O09529 Supervision of elderly multigravida, unspecified trimester: Secondary | ICD-10-CM
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
## 2015-07-25 ENCOUNTER — Other Ambulatory Visit: Payer: Self-pay | Admitting: Certified Nurse Midwife

## 2015-07-25 LAB — URINE CULTURE, OB REFLEX

## 2015-07-25 LAB — CULTURE, OB URINE

## 2015-07-27 LAB — HEMOGLOBINOPATHY EVALUATION
HEMOGLOBIN A2 QUANTITATION: 2.5 % (ref 0.7–3.1)
HGB C: 0 %
HGB S: 0 %
Hemoglobin F Quantitation: 0 % (ref 0.0–2.0)
Hgb A: 97.5 % (ref 94.0–98.0)

## 2015-07-27 LAB — PRENATAL PROFILE I(LABCORP)
Antibody Screen: NEGATIVE
BASOS: 0 %
Basophils Absolute: 0 10*3/uL (ref 0.0–0.2)
EOS (ABSOLUTE): 0.2 10*3/uL (ref 0.0–0.4)
EOS: 2 %
HEMATOCRIT: 38.3 % (ref 34.0–46.6)
HEMOGLOBIN: 12.8 g/dL (ref 11.1–15.9)
HEP B S AG: NEGATIVE
IMMATURE GRANS (ABS): 0.2 10*3/uL — AB (ref 0.0–0.1)
IMMATURE GRANULOCYTES: 1 %
LYMPHS: 21 %
Lymphocytes Absolute: 2.7 10*3/uL (ref 0.7–3.1)
MCH: 30.1 pg (ref 26.6–33.0)
MCHC: 33.4 g/dL (ref 31.5–35.7)
MCV: 90 fL (ref 79–97)
MONOCYTES: 6 %
MONOS ABS: 0.7 10*3/uL (ref 0.1–0.9)
Neutrophils Absolute: 8.8 10*3/uL — ABNORMAL HIGH (ref 1.4–7.0)
Neutrophils: 70 %
Platelets: 306 10*3/uL (ref 150–379)
RBC: 4.25 x10E6/uL (ref 3.77–5.28)
RDW: 14.5 % (ref 12.3–15.4)
RPR: NONREACTIVE
RUBELLA: 17.4 {index} (ref >0.99)
Rh Factor: POSITIVE
WBC: 12.6 10*3/uL — ABNORMAL HIGH (ref 3.4–10.8)

## 2015-07-27 LAB — HEPARIN ANTI-XA: Heparin Anti-Xa: <0.1 IU/mL

## 2015-07-27 LAB — HIV ANTIBODY (ROUTINE TESTING W REFLEX): HIV Screen 4th Generation wRfx: NONREACTIVE

## 2015-07-27 LAB — MATERNIT GENOME: PDF: 0

## 2015-07-27 LAB — VARICELLA ZOSTER ANTIBODY, IGG

## 2015-07-27 LAB — TSH: TSH: 1.22 u[IU]/mL (ref 0.450–4.500)

## 2015-07-28 LAB — QUANTIFERON IN TUBE
QFT TB AG MINUS NIL VALUE: 0.07 [IU]/mL
QUANTIFERON MITOGEN VALUE: 3.7 [IU]/mL
QUANTIFERON NIL VALUE: 0.03 [IU]/mL
QUANTIFERON TB AG VALUE: 0.1 IU/mL
QUANTIFERON TB GOLD: NEGATIVE

## 2015-07-28 LAB — QUANTIFERON TB GOLD ASSAY (BLOOD)

## 2015-08-02 ENCOUNTER — Encounter: Payer: Self-pay | Admitting: *Deleted

## 2015-08-21 ENCOUNTER — Encounter: Payer: Self-pay | Admitting: Obstetrics and Gynecology

## 2015-08-21 ENCOUNTER — Ambulatory Visit (INDEPENDENT_AMBULATORY_CARE_PROVIDER_SITE_OTHER): Payer: Medicaid Other | Admitting: Obstetrics and Gynecology

## 2015-08-21 VITALS — BP 129/78 | HR 88 | Temp 98.1°F | Wt 214.6 lb

## 2015-08-21 DIAGNOSIS — O0990 Supervision of high risk pregnancy, unspecified, unspecified trimester: Secondary | ICD-10-CM | POA: Insufficient documentation

## 2015-08-21 DIAGNOSIS — O09522 Supervision of elderly multigravida, second trimester: Secondary | ICD-10-CM

## 2015-08-21 DIAGNOSIS — O0992 Supervision of high risk pregnancy, unspecified, second trimester: Secondary | ICD-10-CM

## 2015-08-21 DIAGNOSIS — Z86718 Personal history of other venous thrombosis and embolism: Secondary | ICD-10-CM | POA: Insufficient documentation

## 2015-08-21 LAB — POCT URINALYSIS DIPSTICK
Bilirubin, UA: NEGATIVE
Blood, UA: NEGATIVE
GLUCOSE UA: NEGATIVE
KETONES UA: NEGATIVE
LEUKOCYTES UA: NEGATIVE
Nitrite, UA: NEGATIVE
PROTEIN UA: NEGATIVE
Spec Grav, UA: 1.015
UROBILINOGEN UA: 0.2
pH, UA: 6

## 2015-08-21 NOTE — Progress Notes (Signed)
Subjective:  Carmen Lewis is a 40 y.o. [id number] at 1266w3d being seen today for ongoing prenatal care.  She is currently monitored for the following issues for this high-risk pregnancy and has Gonorrhea; Advanced maternal age in multigravida; Supervision of high risk pregnancy, antepartum; and History of DVT (deep vein thrombosis) on her problem list.  Patient reports no complaints.  Contractions: Not present. Vag. Bleeding: None.  Movement: Present. Denies leaking of fluid.   The following portions of the patient's history were reviewed and updated as appropriate: allergies, current medications, past family history, past medical history, past social history, past surgical history and problem list. Problem list updated.  Objective:   Vitals:   08/21/15 0810  BP: 129/78  Pulse: 88  Temp: 98.1 F (36.7 C)  Weight: 214 lb 9.6 oz (97.3 kg)    Fetal Status: Fetal Heart Rate (bpm): 152 Fundal Height: 26 cm Movement: Present     General:  Alert, oriented and cooperative. Patient is in no acute distress.  Skin: Skin is warm and dry. No rash noted.   Cardiovascular: Normal heart rate noted  Respiratory: Normal respiratory effort, no problems with respiration noted  Abdomen: Soft, gravid, appropriate for gestational age. Pain/Pressure: Present     Pelvic:  Cervical exam deferred        Extremities: Normal range of motion.  Edema: Trace  Mental Status: Normal mood and affect. Normal behavior. Normal judgment and thought content.   Urinalysis: Urine Protein: Negative Urine Glucose: Negative  Assessment and Plan:  Pregnancy: [id number] at 5066w3d  1. Advanced maternal age in multigravida, second trimester Patient is doing well without complaints  - POCT Urinalysis Dipstick  2. Supervision of high risk pregnancy, antepartum, second trimester Patient desires BTL- will sign form next visit 2 hr glucola and labs next visit  3. History of DVT (deep vein thrombosis) Continue daily lovenox Plan  for IOL at 39 weeks  Preterm labor symptoms and general obstetric precautions including but not limited to vaginal bleeding, contractions, leaking of fluid and fetal movement were reviewed in detail with the patient. Please refer to After Visit Summary for other counseling recommendations.  Return in about 3 weeks (around 09/11/2015) for ROB/2 hr glucola.   Catalina AntiguaPeggy Takari Duncombe, MD

## 2015-08-21 NOTE — Progress Notes (Signed)
Pt c/o lower abdominal pressure 

## 2015-08-27 ENCOUNTER — Encounter: Payer: Medicaid Other | Admitting: Certified Nurse Midwife

## 2015-08-27 ENCOUNTER — Telehealth: Payer: Self-pay | Admitting: *Deleted

## 2015-08-27 NOTE — Telephone Encounter (Signed)
Patient states she is having numbness in both hands and in her R shoulder. She is having some swelling in her face. She has back pain and her feet are swelling- but they do go down at night with elevation. 11:30 After speaking with patient she does admit to some painful infrequent contractions. And Headaches. Will bring her in for evaluation.

## 2015-08-28 ENCOUNTER — Ambulatory Visit (INDEPENDENT_AMBULATORY_CARE_PROVIDER_SITE_OTHER): Payer: Medicaid Other | Admitting: Certified Nurse Midwife

## 2015-08-28 VITALS — BP 115/71 | HR 86 | Temp 98.1°F | Wt 216.1 lb

## 2015-08-28 DIAGNOSIS — R519 Headache, unspecified: Secondary | ICD-10-CM

## 2015-08-28 DIAGNOSIS — R51 Headache: Secondary | ICD-10-CM

## 2015-08-28 DIAGNOSIS — Z3492 Encounter for supervision of normal pregnancy, unspecified, second trimester: Secondary | ICD-10-CM

## 2015-08-28 DIAGNOSIS — O219 Vomiting of pregnancy, unspecified: Secondary | ICD-10-CM

## 2015-08-28 DIAGNOSIS — O09522 Supervision of elderly multigravida, second trimester: Secondary | ICD-10-CM

## 2015-08-28 DIAGNOSIS — O26892 Other specified pregnancy related conditions, second trimester: Secondary | ICD-10-CM

## 2015-08-28 DIAGNOSIS — O0992 Supervision of high risk pregnancy, unspecified, second trimester: Secondary | ICD-10-CM

## 2015-08-28 LAB — POCT URINALYSIS DIPSTICK
BILIRUBIN UA: NEGATIVE
Glucose, UA: NEGATIVE
LEUKOCYTES UA: NEGATIVE
NITRITE UA: NEGATIVE
PH UA: 5
RBC UA: NEGATIVE
Spec Grav, UA: 1.015
Urobilinogen, UA: 0.2

## 2015-08-28 MED ORDER — ONDANSETRON HCL 4 MG PO TABS: 4.0000 mg | ORAL_TABLET | Freq: Every day | ORAL | 1 refills | Status: DC | PRN

## 2015-08-28 MED ORDER — BUTALBITAL-APAP-CAFFEINE 50-325-40 MG PO TABS: 1.0000 | ORAL_TABLET | Freq: Four times a day (QID) | ORAL | 4 refills | Status: DC | PRN

## 2015-08-28 NOTE — Progress Notes (Signed)
Pt c/o numbness/tingling in right hand, pain in right shoulder and bilateral hip opain. She c/o swelling all over body. Pt c/o headaches.

## 2015-08-28 NOTE — Progress Notes (Signed)
Subjective:    Hurman Hornlicia J Vitiello is a 40 y.o. female being seen today for her obstetrical visit. She is at 7656w3d gestation. Patient reports: backache, headache, nausea, no bleeding, no contractions, no cramping, no leaking, vomiting and states that she has been using Tylenol for pain, complains of pelvic pain, encouraged decreased salt intake, states that she is having numbness in her hands, works about 2 hours/day . Fetal movement: normal.  Problem List Items Addressed This Visit      Other   Advanced maternal age in multigravida   Relevant Orders   US MFM OB FOLLOW UP   Supervision of high risk pregnancy, antepartum    Other Visit Diagnoses    Prenatal care, second trimester    -  Primary   Relevant Orders   POCT Urinalysis Dipstick (Completed)   Headache in pregnancy, antepartum, second trimester       Relevant Medications   butalbital-acetaminophen-caffeine (FIORICET) 50-325-40 MG tablet   Nausea and vomiting during pregnancy       Relevant Medications   ondansetron (ZOFRAN) 4 MG tablet     Patient Active Problem List   Diagnosis Date Noted  . Supervision of high risk pregnancy, antepartum 08/21/2015  . History of DVT (deep vein thrombosis) 08/21/2015  . Advanced maternal age in multigravida 07/06/2015  . Gonorrhea 02/06/2015   Objective:    BP 115/71   Pulse 86   Temp 98.1 F (36.7 C)   Wt 216 lb 1.6 oz (98 kg)   LMP 02/28/2015 (Approximate)   BMI 34.88 kg/m  FHT: 159 BPM  Uterine Size: 26 cm and size equals dates     Assessment:    Pregnancy @ 356w3d    Nausea in pregnancy  Pelvic pain of pregnancy  HA in pregnancy  BTL planned  Plan:   Rx: mother to be abdominal support belt  Fioricet for HA  Zofran for nausea   OBGCT: discussed and ordered for next visit. Signs and symptoms of preterm labor: discussed.  Labs, problem list reviewed and updated 2 hr GTT planned Follow up in 2 weeks.

## 2015-08-28 NOTE — Patient Instructions (Addendum)

## 2015-09-03 ENCOUNTER — Other Ambulatory Visit: Payer: Self-pay | Admitting: Certified Nurse Midwife

## 2015-09-03 ENCOUNTER — Ambulatory Visit (HOSPITAL_COMMUNITY)
Admission: RE | Admit: 2015-09-03 | Discharge: 2015-09-03 | Disposition: A | Discharge status: Home / Self Care | Payer: Medicaid Other | Source: Ambulatory Visit | Attending: Certified Nurse Midwife | Admitting: Certified Nurse Midwife

## 2015-09-03 ENCOUNTER — Encounter (HOSPITAL_COMMUNITY): Payer: Self-pay

## 2015-09-03 DIAGNOSIS — O09522 Supervision of elderly multigravida, second trimester: Secondary | ICD-10-CM

## 2015-09-03 DIAGNOSIS — Z86718 Personal history of other venous thrombosis and embolism: Secondary | ICD-10-CM | POA: Insufficient documentation

## 2015-09-03 DIAGNOSIS — Z3A27 27 weeks gestation of pregnancy: Secondary | ICD-10-CM

## 2015-09-03 DIAGNOSIS — O09892 Supervision of other high risk pregnancies, second trimester: Secondary | ICD-10-CM | POA: Diagnosis not present

## 2015-09-04 ENCOUNTER — Encounter: Payer: Self-pay | Admitting: *Deleted

## 2015-09-11 ENCOUNTER — Encounter: Payer: Medicaid Other | Admitting: Obstetrics & Gynecology

## 2015-09-11 ENCOUNTER — Other Ambulatory Visit: Payer: Medicaid Other

## 2015-09-11 ENCOUNTER — Ambulatory Visit (INDEPENDENT_AMBULATORY_CARE_PROVIDER_SITE_OTHER): Payer: Medicaid Other | Admitting: Obstetrics & Gynecology

## 2015-09-11 VITALS — BP 111/69 | HR 84 | Temp 97.8°F | Wt 214.6 lb

## 2015-09-11 DIAGNOSIS — Z23 Encounter for immunization: Secondary | ICD-10-CM

## 2015-09-11 DIAGNOSIS — Z302 Encounter for sterilization: Secondary | ICD-10-CM | POA: Insufficient documentation

## 2015-09-11 DIAGNOSIS — O09523 Supervision of elderly multigravida, third trimester: Secondary | ICD-10-CM

## 2015-09-11 DIAGNOSIS — Z86718 Personal history of other venous thrombosis and embolism: Secondary | ICD-10-CM

## 2015-09-11 DIAGNOSIS — O0992 Supervision of high risk pregnancy, unspecified, second trimester: Secondary | ICD-10-CM

## 2015-09-11 NOTE — Patient Instructions (Signed)

## 2015-09-11 NOTE — Progress Notes (Addendum)
   PRENATAL VISIT NOTE  Subjective:  Hurman Hornlicia J Ruppe is a 40 y.o. [id number] at 6323w3d being seen today for ongoing prenatal care.  She is currently monitored for the following issues for this high-risk pregnancy and has Gonorrhea; Advanced maternal age in multigravida; Supervision of high risk pregnancy, antepartum; History of DVT (deep vein thrombosis); and Request for sterilization on her problem list.  Patient reports no complaints.  Contractions: Not present. Vag. Bleeding: None.  Movement: Present. Denies leaking of fluid.   The following portions of the patient's history were reviewed and updated as appropriate: allergies, current medications, past family history, past medical history, past social history, past surgical history and problem list. Problem list updated.  Objective:   Vitals:   09/11/15 0904  BP: 111/69  Pulse: 84  Temp: 97.8 F (36.6 C)  Weight: 214 lb 9.6 oz (97.3 kg)    Fetal Status: Fetal Heart Rate (bpm): 150 Fundal Height: 30 cm Movement: Present     General:  Alert, oriented and cooperative. Patient is in no acute distress.  Skin: Skin is warm and dry. No rash noted.   Cardiovascular: Normal heart rate noted  Respiratory: Normal respiratory effort, no problems with respiration noted  Abdomen: Soft, gravid, appropriate for gestational age. Pain/Pressure: Absent     Pelvic:  Cervical exam deferred        Extremities: Normal range of motion.  Edema: None  Mental Status: Normal mood and affect. Normal behavior. Normal judgment and thought content.   Urinalysis: Urine Protein: Negative Urine Glucose: Negative  Assessment and Plan:  Pregnancy: [id number] at 2623w3d  1. History of DVT (deep vein thrombosis) On prophylactic Lovenox  2. Advanced maternal age in multigravida, third trimester Getting MFM growth scans (already scheduled), no need for antenatal testing.  3. Request for sterilization Papers signed today  4. Supervision of high risk pregnancy,  antepartum, second trimester Third trimester labs, Tdap today - Glucose Tolerance, 2 Hours w/1 Hour - CBC - HIV antibody - RPR - Tdap vaccine greater than or equal to 7yo IM  Preterm labor symptoms and general obstetric precautions including but not limited to vaginal bleeding, contractions, leaking of fluid and fetal movement were reviewed in detail with the patient. Please refer to After Visit Summary for other counseling recommendations.  Return in about 2 weeks (around 09/25/2015) for OB Visit.  Tereso NewcomerUgonna A Bracy Pepper, MD

## 2015-09-12 ENCOUNTER — Encounter: Payer: Medicaid Other | Admitting: Certified Nurse Midwife

## 2015-09-12 LAB — CBC
HEMOGLOBIN: 11.5 g/dL (ref 11.1–15.9)
Hematocrit: 35.3 % (ref 34.0–46.6)
MCH: 30.1 pg (ref 26.6–33.0)
MCHC: 32.6 g/dL (ref 31.5–35.7)
MCV: 92 fL (ref 79–97)
PLATELETS: 264 10*3/uL (ref 150–379)
RBC: 3.82 x10E6/uL (ref 3.77–5.28)
RDW: 14.7 % (ref 12.3–15.4)
WBC: 12.2 10*3/uL — AB (ref 3.4–10.8)

## 2015-09-12 LAB — GLUCOSE TOLERANCE, 2 HOURS W/ 1HR
GLUCOSE, 1 HOUR: 127 mg/dL (ref 65–179)
GLUCOSE, 2 HOUR: 98 mg/dL (ref 65–152)
Glucose, Fasting: 82 mg/dL (ref 65–91)

## 2015-09-12 LAB — RPR: RPR: NONREACTIVE

## 2015-09-12 LAB — HIV ANTIBODY (ROUTINE TESTING W REFLEX): HIV SCREEN 4TH GENERATION: NONREACTIVE

## 2015-09-14 ENCOUNTER — Telehealth: Payer: Self-pay | Admitting: Pediatrics

## 2015-09-14 NOTE — Telephone Encounter (Signed)
Pt requesting results from 2 hr GTT earlier this week. I advised her physician has not signed yet so I can not give results. She voiced understanding.

## 2015-09-18 ENCOUNTER — Encounter: Payer: Self-pay | Admitting: *Deleted

## 2015-09-20 ENCOUNTER — Encounter: Payer: Self-pay | Admitting: *Deleted

## 2015-09-20 LAB — PROCEDURE REPORT - SCANNED: PAP SMEAR: NEGATIVE

## 2015-09-25 ENCOUNTER — Ambulatory Visit (INDEPENDENT_AMBULATORY_CARE_PROVIDER_SITE_OTHER): Payer: Medicaid Other | Admitting: Obstetrics and Gynecology

## 2015-09-25 VITALS — BP 117/76 | HR 82 | Temp 99.0°F | Wt 216.0 lb

## 2015-09-25 DIAGNOSIS — Z86718 Personal history of other venous thrombosis and embolism: Secondary | ICD-10-CM

## 2015-09-25 DIAGNOSIS — O0992 Supervision of high risk pregnancy, unspecified, second trimester: Secondary | ICD-10-CM | POA: Diagnosis not present

## 2015-09-25 DIAGNOSIS — O09523 Supervision of elderly multigravida, third trimester: Secondary | ICD-10-CM | POA: Diagnosis not present

## 2015-09-25 DIAGNOSIS — Z302 Encounter for sterilization: Secondary | ICD-10-CM

## 2015-09-25 NOTE — Progress Notes (Signed)
Patient states that she feels pressure that comes and goes with irregular contractions, not daily.

## 2015-09-25 NOTE — Progress Notes (Signed)
Subjective:  Carmen Lewis is a 40 y.o. [id number] at 856w3d being seen today for ongoing prenatal care.  She is currently monitored for the following issues for this low-risk pregnancy and has Advanced maternal age in multigravida; Supervision of high risk pregnancy, antepartum; History of DVT (deep vein thrombosis); and Request for sterilization on her problem list.  Patient reports carpal tunnel symptoms.  Contractions: Irregular. Vag. Bleeding: None.  Movement: Present. Denies leaking of fluid.   The following portions of the patient's history were reviewed and updated as appropriate: allergies, current medications, past family history, past medical history, past social history, past surgical history and problem list. Problem list updated.  Objective:   Vitals:   09/25/15 0902  BP: 117/76  Pulse: 82  Temp: 99 F (37.2 C)  Weight: 216 lb (98 kg)    Fetal Status:     Movement: Present     General:  Alert, oriented and cooperative. Patient is in no acute distress.  Skin: Skin is warm and dry. No rash noted.   Cardiovascular: Normal heart rate noted  Respiratory: Normal respiratory effort, no problems with respiration noted  Abdomen: Soft, gravid, appropriate for gestational age. Pain/Pressure: Present     Pelvic:  Cervical exam deferred        Extremities: Normal range of motion.  Edema: None  Mental Status: Normal mood and affect. Normal behavior. Normal judgment and thought content.   Urinalysis:      Assessment and Plan:  Pregnancy: [id number] at 4356w3d  1. Supervision of high risk pregnancy, antepartum, second trimester   2. Request for sterilization Paper signed  3. History of DVT (deep vein thrombosis) Continue with Lovenox  4. Advanced maternal age in multigravida, third trimester F/U U/S scheduled  Preterm labor symptoms and general obstetric precautions including but not limited to vaginal bleeding, contractions, leaking of fluid and fetal movement were reviewed in  detail with the patient. Please refer to After Visit Summary for other counseling recommendations.  No Follow-up on file.   Hermina StaggersMichael L Payam Gribble, MD

## 2015-10-11 ENCOUNTER — Ambulatory Visit (INDEPENDENT_AMBULATORY_CARE_PROVIDER_SITE_OTHER): Payer: Medicaid Other | Admitting: Obstetrics & Gynecology

## 2015-10-11 VITALS — BP 125/74 | HR 83 | Wt 217.0 lb

## 2015-10-11 DIAGNOSIS — O0990 Supervision of high risk pregnancy, unspecified, unspecified trimester: Secondary | ICD-10-CM

## 2015-10-11 DIAGNOSIS — Z86718 Personal history of other venous thrombosis and embolism: Secondary | ICD-10-CM

## 2015-10-11 DIAGNOSIS — O09523 Supervision of elderly multigravida, third trimester: Secondary | ICD-10-CM | POA: Diagnosis not present

## 2015-10-11 NOTE — Progress Notes (Signed)
US for growth in 3 days   PRENATAL VISIT NOTE  Subjective:  Carmen Lewis is a 40 y.o. [id number] at 4724w5d being seen today for ongoing prenatal care.  She is currently monitored for the following issues for this high-risk pregnancy and has Advanced maternal age in multigravida; Supervision of high risk pregnancy, antepartum; History of DVT (deep vein thrombosis); and Request for sterilization on her problem list.  Patient reports hip pain.  Contractions: Irregular. Vag. Bleeding: None.  Movement: Present. Denies leaking of fluid.   The following portions of the patient's history were reviewed and updated as appropriate: allergies, current medications, past family history, past medical history, past social history, past surgical history and problem list. Problem list updated.  Objective:   Vitals:   10/11/15 1514  BP: 125/74  Pulse: 83  Weight: 217 lb (98.4 kg)    Fetal Status: Fetal Heart Rate (bpm): 140   Movement: Present     General:  Alert, oriented and cooperative. Patient is in no acute distress.  Skin: Skin is warm and dry. No rash noted.   Cardiovascular: Normal heart rate noted  Respiratory: Normal respiratory effort, no problems with respiration noted  Abdomen: Soft, gravid, appropriate for gestational age. Pain/Pressure: Present     Pelvic:  Cervical exam deferred        Extremities: Normal range of motion.  Edema: Trace  Mental Status: Normal mood and affect. Normal behavior. Normal judgment and thought content.   Urinalysis:      Assessment and Plan:  Pregnancy: [id number] at 324w5d  1. Supervision of high risk pregnancy, antepartum CTS sx R>L - Wrist brace cock up volar  2. History of DVT (deep vein thrombosis) Lovenox  3. Elderly multigravida in third trimester 39 week induction  Preterm labor symptoms and general obstetric precautions including but not limited to vaginal bleeding, contractions, leaking of fluid and fetal movement were reviewed in detail with  the patient. Please refer to After Visit Summary for other counseling recommendations.  2 week f/u, [place] next week Adam PhenixJames G Rjay Revolorio, MD

## 2015-10-11 NOTE — Patient Instructions (Signed)

## 2015-10-12 ENCOUNTER — Encounter (HOSPITAL_COMMUNITY): Payer: Self-pay

## 2015-10-15 ENCOUNTER — Encounter (HOSPITAL_COMMUNITY): Payer: Self-pay

## 2015-10-15 ENCOUNTER — Other Ambulatory Visit (HOSPITAL_COMMUNITY): Payer: Self-pay | Admitting: Maternal and Fetal Medicine

## 2015-10-15 ENCOUNTER — Ambulatory Visit (HOSPITAL_COMMUNITY)
Admission: RE | Admit: 2015-10-15 | Discharge: 2015-10-15 | Disposition: A | Discharge status: Home / Self Care | Payer: Medicaid Other | Source: Ambulatory Visit | Attending: Certified Nurse Midwife | Admitting: Certified Nurse Midwife

## 2015-10-15 DIAGNOSIS — O09523 Supervision of elderly multigravida, third trimester: Secondary | ICD-10-CM | POA: Insufficient documentation

## 2015-10-15 DIAGNOSIS — Z7901 Long term (current) use of anticoagulants: Secondary | ICD-10-CM | POA: Diagnosis not present

## 2015-10-15 DIAGNOSIS — Z302 Encounter for sterilization: Secondary | ICD-10-CM

## 2015-10-15 DIAGNOSIS — Z3A33 33 weeks gestation of pregnancy: Secondary | ICD-10-CM

## 2015-10-15 DIAGNOSIS — Z86718 Personal history of other venous thrombosis and embolism: Secondary | ICD-10-CM | POA: Insufficient documentation

## 2015-10-15 DIAGNOSIS — Z362 Encounter for other antenatal screening follow-up: Secondary | ICD-10-CM

## 2015-10-15 DIAGNOSIS — O09522 Supervision of elderly multigravida, second trimester: Secondary | ICD-10-CM

## 2015-10-15 DIAGNOSIS — O0990 Supervision of high risk pregnancy, unspecified, unspecified trimester: Secondary | ICD-10-CM

## 2015-10-16 ENCOUNTER — Encounter: Payer: Self-pay | Admitting: Obstetrics & Gynecology

## 2015-10-23 ENCOUNTER — Ambulatory Visit (INDEPENDENT_AMBULATORY_CARE_PROVIDER_SITE_OTHER): Payer: Medicaid Other | Admitting: Certified Nurse Midwife

## 2015-10-23 VITALS — BP 124/77 | HR 80 | Wt 217.0 lb

## 2015-10-23 DIAGNOSIS — M778 Other enthesopathies, not elsewhere classified: Secondary | ICD-10-CM

## 2015-10-23 DIAGNOSIS — O0990 Supervision of high risk pregnancy, unspecified, unspecified trimester: Secondary | ICD-10-CM

## 2015-10-23 DIAGNOSIS — Z86718 Personal history of other venous thrombosis and embolism: Secondary | ICD-10-CM

## 2015-10-23 DIAGNOSIS — Z7901 Long term (current) use of anticoagulants: Secondary | ICD-10-CM | POA: Diagnosis not present

## 2015-10-23 DIAGNOSIS — O09523 Supervision of elderly multigravida, third trimester: Secondary | ICD-10-CM

## 2015-10-23 DIAGNOSIS — B951 Streptococcus, group B, as the cause of diseases classified elsewhere: Secondary | ICD-10-CM | POA: Insufficient documentation

## 2015-10-23 MED ORDER — WRIST BRACE DELUXE/RIGHT L-XL MISC: 1.0000 "application " | Freq: Every day | 0 refills | Status: DC

## 2015-10-23 NOTE — Progress Notes (Signed)
Subjective:    Carmen Lewis is a 40 y.o. female being seen today for her obstetrical visit. She is at 4652w3d gestation. Patient reports backache, no bleeding, no leaking, occasional contractions and wrist pain. Fetal movement: normal.  Problem List Items Addressed This Visit      Other   Supervision of high risk pregnancy, antepartum   Group beta Strep positive    Other Visit Diagnoses    Right wrist tendinitis    -  Primary   Relevant Medications   Elastic Bandages & Supports (WRIST BRACE DELUXE/RIGHT L-XL) MISC   Elderly multigravida in third trimester         Patient Active Problem List   Diagnosis Date Noted  . Group beta Strep positive 10/23/2015  . Request for sterilization 09/11/2015  . Supervision of high risk pregnancy, antepartum 08/21/2015  . History of DVT (deep vein thrombosis) 08/21/2015  . Advanced maternal age in multigravida 07/06/2015   Objective:    BP 124/77   Pulse 80   Wt 217 lb (98.4 kg)   LMP 02/28/2015 (Approximate)   BMI 35.02 kg/m  FHT:  156 BPM  Uterine Size: 35 cm and size equals dates  Presentation: cephalic     Assessment:    Pregnancy @ 7852w3d weeks   Right wrist carpel tunnel symptoms AMA  H/O DVT: on levenox  Plan:   US @ MFM reviewed   labs reviewed, problem list updated Consent signed. GBS sent TDAP offered  Rhogam given for RH negative Pediatrician: discussed. Infant feeding: plans to breastfeed. Maternity leave: discussed. Cigarette smoking: never smoked. No orders of the defined types were placed in this encounter.  Meds ordered this encounter  Medications  . Elastic Bandages & Supports (WRIST BRACE DELUXE/RIGHT L-XL) MISC    Sig: 1 application by Does not apply route daily.    Dispense:  1 each    Refill:  0   Follow up in 1 Week.

## 2015-10-25 ENCOUNTER — Encounter: Payer: Managed Care, Other (non HMO) | Admitting: Obstetrics and Gynecology

## 2015-11-01 ENCOUNTER — Ambulatory Visit (INDEPENDENT_AMBULATORY_CARE_PROVIDER_SITE_OTHER): Payer: Medicaid Other | Admitting: Advanced Practice Midwife

## 2015-11-01 ENCOUNTER — Other Ambulatory Visit (HOSPITAL_COMMUNITY)
Admission: RE | Admit: 2015-11-01 | Discharge: 2015-11-01 | Disposition: A | Discharge status: Home / Self Care | Payer: Medicaid Other | Source: Ambulatory Visit | Attending: Advanced Practice Midwife | Admitting: Advanced Practice Midwife

## 2015-11-01 VITALS — BP 115/79 | HR 96 | Wt 218.0 lb

## 2015-11-01 DIAGNOSIS — Z8759 Personal history of other complications of pregnancy, childbirth and the puerperium: Secondary | ICD-10-CM | POA: Diagnosis not present

## 2015-11-01 DIAGNOSIS — O09523 Supervision of elderly multigravida, third trimester: Secondary | ICD-10-CM | POA: Diagnosis not present

## 2015-11-01 DIAGNOSIS — Z113 Encounter for screening for infections with a predominantly sexual mode of transmission: Secondary | ICD-10-CM | POA: Insufficient documentation

## 2015-11-01 DIAGNOSIS — Z86718 Personal history of other venous thrombosis and embolism: Secondary | ICD-10-CM | POA: Diagnosis not present

## 2015-11-01 DIAGNOSIS — O0990 Supervision of high risk pregnancy, unspecified, unspecified trimester: Secondary | ICD-10-CM

## 2015-11-01 DIAGNOSIS — Z3A36 36 weeks gestation of pregnancy: Secondary | ICD-10-CM

## 2015-11-02 LAB — OB RESULTS CONSOLE GBS: STREP GROUP B AG: NEGATIVE

## 2015-11-03 LAB — STREP GP B NAA: Strep Gp B NAA: NEGATIVE

## 2015-11-05 LAB — GC/CHLAMYDIA PROBE AMP (~~LOC~~) NOT AT ARMC
Chlamydia: NEGATIVE
Neisseria Gonorrhea: NEGATIVE

## 2015-11-09 ENCOUNTER — Encounter (HOSPITAL_COMMUNITY): Payer: Self-pay

## 2015-11-09 ENCOUNTER — Ambulatory Visit (HOSPITAL_COMMUNITY)
Admission: RE | Admit: 2015-11-09 | Discharge: 2015-11-09 | Disposition: A | Discharge status: Home / Self Care | Payer: Medicaid Other | Source: Ambulatory Visit | Attending: Advanced Practice Midwife | Admitting: Advanced Practice Midwife

## 2015-11-09 ENCOUNTER — Ambulatory Visit (INDEPENDENT_AMBULATORY_CARE_PROVIDER_SITE_OTHER): Payer: Medicaid Other | Admitting: Obstetrics & Gynecology

## 2015-11-09 VITALS — BP 112/77 | HR 86 | Temp 97.6°F | Wt 218.0 lb

## 2015-11-09 DIAGNOSIS — O09523 Supervision of elderly multigravida, third trimester: Secondary | ICD-10-CM | POA: Insufficient documentation

## 2015-11-09 DIAGNOSIS — Z86718 Personal history of other venous thrombosis and embolism: Secondary | ICD-10-CM

## 2015-11-09 DIAGNOSIS — Z3A36 36 weeks gestation of pregnancy: Secondary | ICD-10-CM

## 2015-11-09 DIAGNOSIS — Z302 Encounter for sterilization: Secondary | ICD-10-CM

## 2015-11-09 DIAGNOSIS — O2233 Deep phlebothrombosis in pregnancy, third trimester: Secondary | ICD-10-CM | POA: Insufficient documentation

## 2015-11-09 DIAGNOSIS — O0990 Supervision of high risk pregnancy, unspecified, unspecified trimester: Secondary | ICD-10-CM

## 2015-11-09 DIAGNOSIS — Z8759 Personal history of other complications of pregnancy, childbirth and the puerperium: Secondary | ICD-10-CM

## 2015-11-09 NOTE — Patient Instructions (Signed)
Return to clinic for any scheduled appointments or obstetric concerns, or go to MAU for evaluation  

## 2015-11-09 NOTE — Progress Notes (Signed)
Patient is in the office states that she feels contractions periodically, reports good fetal movement.

## 2015-11-09 NOTE — Progress Notes (Signed)
   PRENATAL VISIT NOTE  Subjective:  Carmen Lewis is a 40 y.o. [id number] at 5417w6d being seen today for ongoing prenatal care.  She is currently monitored for the following issues for this high-risk pregnancy and has Advanced maternal age in multigravida; Supervision of high risk pregnancy, antepartum; History of DVT (deep vein thrombosis); Request for sterilization; and Group beta Strep positive on her problem list.  Patient reports occasional contractions.  Contractions: Irregular. Vag. Bleeding: None.  Movement: Present. Denies leaking of fluid.   The following portions of the patient's history were reviewed and updated as appropriate: allergies, current medications, past family history, past medical history, past social history, past surgical history and problem list. Problem list updated.  Objective:   Vitals:   11/09/15 1203  BP: 112/77  Pulse: 86  Temp: 97.6 F (36.4 C)  Weight: 218 lb (98.9 kg)    Fetal Status: Fetal Heart Rate (bpm): 152 Fundal Height: 38 cm Movement: Present     General:  Alert, oriented and cooperative. Patient is in no acute distress.  Skin: Skin is warm and dry. No rash noted.   Cardiovascular: Normal heart rate noted  Respiratory: Normal respiratory effort, no problems with respiration noted  Abdomen: Soft, gravid, appropriate for gestational age. Pain/Pressure: Absent     Pelvic:  Cervical exam deferred        Extremities: Normal range of motion.  Edema: Trace  Mental Status: Normal mood and affect. Normal behavior. Normal judgment and thought content.   Assessment and Plan:  Pregnancy: [id number] at 7117w6d  1. History of DVT (deep vein thrombosis) On Lovenox. IOL scheduled on 11/24/15 at 0700, advised not to take Lovenox on 11/23/15.  2. Elderly multigravida in third trimester Had growth scan today, EFW 66% (6-11), nml AFV, cephalic  3. Supervision of high risk pregnancy, antepartum Labor symptoms and general obstetric precautions including but  not limited to vaginal bleeding, contractions, leaking of fluid and fetal movement were reviewed in detail with the patient. Please refer to After Visit Summary for other counseling recommendations.  Return in about 1 week (around 11/16/2015) for OB Visit.   Carmen NewcomerUgonna A Armando Bukhari, MD

## 2015-11-12 ENCOUNTER — Encounter: Payer: Medicaid Other | Admitting: Obstetrics & Gynecology

## 2015-11-12 ENCOUNTER — Encounter (HOSPITAL_COMMUNITY): Payer: Self-pay | Admitting: *Deleted

## 2015-11-12 ENCOUNTER — Inpatient Hospital Stay (HOSPITAL_COMMUNITY)
Admission: AD | Admit: 2015-11-12 | Discharge: 2015-11-12 | Disposition: A | Discharge status: Home / Self Care | Payer: Medicaid Other | Source: Ambulatory Visit | Attending: Family Medicine | Admitting: Family Medicine

## 2015-11-12 DIAGNOSIS — Z302 Encounter for sterilization: Secondary | ICD-10-CM

## 2015-11-12 DIAGNOSIS — Z3A38 38 weeks gestation of pregnancy: Secondary | ICD-10-CM | POA: Diagnosis not present

## 2015-11-12 DIAGNOSIS — Z3493 Encounter for supervision of normal pregnancy, unspecified, third trimester: Secondary | ICD-10-CM | POA: Diagnosis present

## 2015-11-12 DIAGNOSIS — O0990 Supervision of high risk pregnancy, unspecified, unspecified trimester: Secondary | ICD-10-CM

## 2015-11-12 DIAGNOSIS — Z86718 Personal history of other venous thrombosis and embolism: Secondary | ICD-10-CM

## 2015-11-12 NOTE — MAU Note (Signed)
Pt reports contractions and pressure.  

## 2015-11-14 ENCOUNTER — Ambulatory Visit (INDEPENDENT_AMBULATORY_CARE_PROVIDER_SITE_OTHER): Payer: Medicaid Other | Admitting: Obstetrics & Gynecology

## 2015-11-14 VITALS — BP 105/68 | HR 92 | Temp 97.5°F | Wt 222.0 lb

## 2015-11-14 DIAGNOSIS — Z86718 Personal history of other venous thrombosis and embolism: Secondary | ICD-10-CM | POA: Diagnosis not present

## 2015-11-14 DIAGNOSIS — O0990 Supervision of high risk pregnancy, unspecified, unspecified trimester: Secondary | ICD-10-CM

## 2015-11-14 DIAGNOSIS — O0993 Supervision of high risk pregnancy, unspecified, third trimester: Secondary | ICD-10-CM | POA: Diagnosis not present

## 2015-11-14 NOTE — Patient Instructions (Signed)
Vaginal Delivery °During delivery, your health care provider will help you give birth to your baby. During a vaginal delivery, you will work to push the baby out of your vagina. However, before you can push your baby out, a few things need to happen. The opening of your uterus (cervix) has to soften, thin out, and open up (dilate) all the way to 10 cm. Also, your baby has to move down from the uterus into your vagina.  °SIGNS OF LABOR  °Your health care provider will first need to make sure you are in labor. Signs of labor include:  °· Passing what is called the mucous plug before labor begins. This is a small amount of blood-stained mucus. °· Having regular, painful uterine contractions.   °· The time between contractions gets shorter.   °· The discomfort and pain gradually get more intense. °· Contraction pains get worse when walking and do not go away when resting.   °· Your cervix becomes thinner (effacement) and dilates. °BEFORE THE DELIVERY °Once you are in labor and admitted into the hospital or care center, your health care provider may do the following:  °· Perform a complete physical exam. °· Review any complications related to pregnancy or labor.  °· Check your blood pressure, pulse, temperature, and heart rate (vital signs).   °· Determine if, and when, the rupture of amniotic membranes occurred. °· Do a vaginal exam (using a sterile glove and lubricant) to determine:   °¨ The position (presentation) of the baby. Is the baby's head presenting first (vertex) in the birth canal (vagina), or are the feet or buttocks first (breech)?   °¨ The level (station) of the baby's head within the birth canal.   °¨ The effacement and dilatation of the cervix.   °· An electronic fetal monitor is usually placed on your abdomen when you first arrive. This is used to monitor your contractions and the baby's heart rate. °¨ When the monitor is on your abdomen (external fetal monitor), it can only pick up the frequency and  length of your contractions. It cannot tell the strength of your contractions. °¨ If it becomes necessary for your health care provider to know exactly how strong your contractions are or to see exactly what the baby's heart rate is doing, an internal monitor may be inserted into your vagina and uterus. Your health care provider will discuss the benefits and risks of using an internal monitor and obtain your permission before inserting the device. °¨ Continuous fetal monitoring may be needed if you have an epidural, are receiving certain medicines (such as oxytocin), or have pregnancy or labor complications. °· An IV access tube may be placed into a vein in your arm to deliver fluids and medicines if necessary. °THREE STAGES OF LABOR AND DELIVERY °Normal labor and delivery is divided into three stages. °First Stage °This stage starts when you begin to contract regularly and your cervix begins to efface and dilate. It ends when your cervix is completely open (fully dilated). The first stage is the longest stage of labor and can last from 3 hours to 15 hours.  °Several methods are available to help with labor pain. You and your health care provider will decide which option is best for you. Options include:  °· Opioid medicines. These are strong pain medicines that you can get through your IV tube or as a shot into your muscle. These medicines lessen pain but do not make it go away completely.  °· Epidural. A medicine is given through a thin tube that   is inserted in your back. The medicine numbs the lower part of your body and prevents any pain in that area. °· Paracervical pain medicine. This is an injection of an anesthetic on each side of your cervix.   °· You may request natural childbirth, which does not involve the use of pain medicines or an epidural during labor and delivery. Instead, you will use other things, such as breathing exercises, to help cope with the pain. °Second Stage °The second stage of labor  begins when your cervix is fully dilated at 10 cm. It continues until you push your baby down through the birth canal and the baby is born. This stage can take only minutes or several hours. °· The location of your baby's head as it moves through the birth canal is reported as a number called a station. If the baby's head has not started its descent, the station is described as being at minus 3 (-3). When your baby's head is at the zero station, it is at the middle of the birth canal and is engaged in the pelvis. The station of your baby helps indicate the progress of the second stage of labor. °· When your baby is born, your health care provider may hold the baby with his or her head lowered to prevent amniotic fluid, mucus, and blood from getting into the baby's lungs. The baby's mouth and nose may be suctioned with a small bulb syringe to remove any additional fluid. °· Your health care provider may then place the baby on your stomach. It is important to keep the baby from getting cold. To do this, the health care provider will dry the baby off, place the baby directly on your skin (with no blankets between you and the baby), and cover the baby with warm, dry blankets.   °· The umbilical cord is cut. °Third Stage °During the third stage of labor, your health care provider will deliver the placenta (afterbirth) and make sure your bleeding is under control. The delivery of the placenta usually takes about 5 minutes but can take up to 30 minutes. After the placenta is delivered, a medicine may be given either by IV or injection to help contract the uterus and control bleeding. If you are planning to breastfeed, you can try to do so now. °After you deliver the placenta, your uterus should contract and get very firm. If your uterus does not remain firm, your health care provider will massage it. This is important because the contraction of the uterus helps cut off bleeding at the site where the placenta was attached  to your uterus. If your uterus does not contract properly and stay firm, you may continue to bleed heavily. If there is a lot of bleeding, medicines may be given to contract the uterus and stop the bleeding.  °  °This information is not intended to replace advice given to you by your health care provider. Make sure you discuss any questions you have with your health care provider. °  °Document Released: 10/02/2007 Document Revised: 01/13/2014 Document Reviewed: 08/20/2011 °Elsevier Interactive Patient Education ©2016 [employer] ° °

## 2015-11-14 NOTE — Progress Notes (Signed)
Patient states that she feels good today, reports good fetal movement. 

## 2015-11-14 NOTE — Progress Notes (Signed)
   PRENATAL VISIT NOTE  Subjective:  Carmen Lewis is a 40 y.o. [id number] at 3930w4d being seen today for ongoing prenatal care.  She is currently monitored for the following issues for this high-risk pregnancy and has Advanced maternal age in multigravida; Supervision of high risk pregnancy, antepartum; History of DVT (deep vein thrombosis); Request for sterilization; and Group beta Strep positive on her problem list.  Patient reports no complaints.  Contractions: Not present. Vag. Bleeding: None.  Movement: Present. Denies leaking of fluid.   The following portions of the patient's history were reviewed and updated as appropriate: allergies, current medications, past family history, past medical history, past social history, past surgical history and problem list. Problem list updated.  Objective:   Vitals:   11/14/15 0836  BP: 105/68  Pulse: 92  Temp: 97.5 F (36.4 C)  Weight: 222 lb (100.7 kg)    Fetal Status: Fetal Heart Rate (bpm): 141   Movement: Present     General:  Alert, oriented and cooperative. Patient is in no acute distress.  Skin: Skin is warm and dry. No rash noted.   Cardiovascular: Normal heart rate noted  Respiratory: Normal respiratory effort, no problems with respiration noted  Abdomen: Soft, gravid, appropriate for gestational age. Pain/Pressure: Absent     Pelvic:  Cervical exam deferred        Extremities: Normal range of motion.  Edema: Trace  Mental Status: Normal mood and affect. Normal behavior. Normal judgment and thought content.   Assessment and Plan:  Pregnancy: [id number] at 6330w4d  1. Supervision of high risk pregnancy, antepartum AMA and h/o DVT  2. History of DVT (deep vein thrombosis) Lovenox and IOL is scheduled  Term labor symptoms and general obstetric precautions including but not limited to vaginal bleeding, contractions, leaking of fluid  IOL 39 weeksd fetal movement were reviewed in detail with the patient. Please refer to After Visit  Summary for other counseling recommendations.  Return in about 1 week (around 11/21/2015).   Adam PhenixJames G Jatasia Gundrum, MD

## 2015-11-15 ENCOUNTER — Telehealth (HOSPITAL_COMMUNITY): Payer: Self-pay | Admitting: *Deleted

## 2015-11-15 NOTE — Telephone Encounter (Signed)
Preadmission screen  

## 2015-11-16 ENCOUNTER — Encounter (HOSPITAL_COMMUNITY): Payer: Self-pay | Admitting: *Deleted

## 2015-11-16 ENCOUNTER — Telehealth (HOSPITAL_COMMUNITY): Payer: Self-pay | Admitting: *Deleted

## 2015-11-16 NOTE — Telephone Encounter (Signed)
Preadmission screen  

## 2015-11-23 ENCOUNTER — Other Ambulatory Visit: Payer: Self-pay | Admitting: Advanced Practice Midwife

## 2015-11-24 ENCOUNTER — Inpatient Hospital Stay (HOSPITAL_COMMUNITY): Payer: Medicaid Other | Admitting: Anesthesiology

## 2015-11-24 ENCOUNTER — Encounter (HOSPITAL_COMMUNITY): Payer: Self-pay

## 2015-11-24 ENCOUNTER — Inpatient Hospital Stay (HOSPITAL_COMMUNITY)
Admission: RE | Admit: 2015-11-24 | Discharge: 2015-11-27 | DRG: 767 | Disposition: A | Discharge status: Home / Self Care | Payer: Medicaid Other | Source: Ambulatory Visit | Attending: Obstetrics & Gynecology | Admitting: Obstetrics & Gynecology

## 2015-11-24 DIAGNOSIS — Z833 Family history of diabetes mellitus: Secondary | ICD-10-CM

## 2015-11-24 DIAGNOSIS — D68318 Other hemorrhagic disorder due to intrinsic circulating anticoagulants, antibodies, or inhibitors: Secondary | ICD-10-CM | POA: Diagnosis not present

## 2015-11-24 DIAGNOSIS — Z3A39 39 weeks gestation of pregnancy: Secondary | ICD-10-CM | POA: Diagnosis not present

## 2015-11-24 DIAGNOSIS — O3483 Maternal care for other abnormalities of pelvic organs, third trimester: Secondary | ICD-10-CM | POA: Diagnosis present

## 2015-11-24 DIAGNOSIS — O99214 Obesity complicating childbirth: Secondary | ICD-10-CM | POA: Diagnosis present

## 2015-11-24 DIAGNOSIS — E669 Obesity, unspecified: Secondary | ICD-10-CM | POA: Diagnosis present

## 2015-11-24 DIAGNOSIS — Z8249 Family history of ischemic heart disease and other diseases of the circulatory system: Secondary | ICD-10-CM

## 2015-11-24 DIAGNOSIS — Z302 Encounter for sterilization: Secondary | ICD-10-CM | POA: Diagnosis not present

## 2015-11-24 DIAGNOSIS — Z6838 Body mass index (BMI) 38.0-38.9, adult: Secondary | ICD-10-CM

## 2015-11-24 DIAGNOSIS — Z8759 Personal history of other complications of pregnancy, childbirth and the puerperium: Secondary | ICD-10-CM

## 2015-11-24 DIAGNOSIS — N838 Other noninflammatory disorders of ovary, fallopian tube and broad ligament: Secondary | ICD-10-CM | POA: Diagnosis present

## 2015-11-24 DIAGNOSIS — Z3403 Encounter for supervision of normal first pregnancy, third trimester: Secondary | ICD-10-CM | POA: Diagnosis present

## 2015-11-24 DIAGNOSIS — O0990 Supervision of high risk pregnancy, unspecified, unspecified trimester: Secondary | ICD-10-CM

## 2015-11-24 DIAGNOSIS — O99824 Streptococcus B carrier state complicating childbirth: Principal | ICD-10-CM | POA: Diagnosis present

## 2015-11-24 DIAGNOSIS — Z86718 Personal history of other venous thrombosis and embolism: Secondary | ICD-10-CM | POA: Diagnosis not present

## 2015-11-24 DIAGNOSIS — Z91040 Latex allergy status: Secondary | ICD-10-CM | POA: Diagnosis not present

## 2015-11-24 DIAGNOSIS — Z7901 Long term (current) use of anticoagulants: Secondary | ICD-10-CM

## 2015-11-24 DIAGNOSIS — O9912 Other diseases of the blood and blood-forming organs and certain disorders involving the immune mechanism complicating childbirth: Secondary | ICD-10-CM | POA: Diagnosis not present

## 2015-11-24 LAB — CBC
HCT: 32.2 % — ABNORMAL LOW (ref 36.0–46.0)
Hemoglobin: 11 g/dL — ABNORMAL LOW (ref 12.0–15.0)
MCH: 29.8 pg (ref 26.0–34.0)
MCHC: 34.2 g/dL (ref 30.0–36.0)
MCV: 87.3 fL (ref 78.0–100.0)
PLATELETS: 261 10*3/uL (ref 150–400)
RBC: 3.69 MIL/uL — ABNORMAL LOW (ref 3.87–5.11)
RDW: 15.4 % (ref 11.5–15.5)
WBC: 11.2 10*3/uL — AB (ref 4.0–10.5)

## 2015-11-24 LAB — OB RESULTS CONSOLE GBS: GBS: POSITIVE

## 2015-11-24 MED ORDER — EPHEDRINE 5 MG/ML INJ: 10.0000 mg | INTRAVENOUS | Status: DC | PRN

## 2015-11-24 MED ORDER — PENICILLIN G POT IN DEXTROSE 60000 UNIT/ML IV SOLN
3.0000 10*6.[IU] | INTRAVENOUS | Status: DC
  Administered 2015-11-24 – 2015-11-25 (×4): 3 10*6.[IU] via INTRAVENOUS
  Filled 2015-11-24 (×7): qty 50

## 2015-11-24 MED ORDER — FENTANYL 2.5 MCG/ML BUPIVACAINE 1/10 % EPIDURAL INFUSION (WH - ANES)
14.0000 mL/h | INTRAMUSCULAR | Status: DC | PRN
  Administered 2015-11-24 – 2015-11-25 (×5): 14 mL/h via EPIDURAL
  Filled 2015-11-24 (×6): qty 100

## 2015-11-24 MED ORDER — DEXTROSE 5 % IV SOLN
5.0000 10*6.[IU] | Freq: Once | INTRAVENOUS | Status: AC
  Administered 2015-11-24: 5 10*6.[IU] via INTRAVENOUS
  Filled 2015-11-24: qty 5

## 2015-11-24 MED ORDER — OXYCODONE-ACETAMINOPHEN 5-325 MG PO TABS: 2.0000 | ORAL_TABLET | ORAL | Status: DC | PRN

## 2015-11-24 MED ORDER — ONDANSETRON HCL 4 MG/2ML IJ SOLN
4.0000 mg | Freq: Four times a day (QID) | INTRAMUSCULAR | Status: DC | PRN
  Administered 2015-11-24 – 2015-11-25 (×2): 4 mg via INTRAVENOUS
  Filled 2015-11-24 (×2): qty 2

## 2015-11-24 MED ORDER — TERBUTALINE SULFATE 1 MG/ML IJ SOLN: 0.2500 mg | Freq: Once | INTRAMUSCULAR | Status: DC | PRN

## 2015-11-24 MED ORDER — OXYTOCIN BOLUS FROM INFUSION
500.0000 mL | Freq: Once | INTRAVENOUS | Status: AC
  Administered 2015-11-25: 500 mL via INTRAVENOUS

## 2015-11-24 MED ORDER — MISOPROSTOL 25 MCG QUARTER TABLET
25.0000 ug | ORAL_TABLET | ORAL | Status: DC | PRN
  Administered 2015-11-24: 25 ug via VAGINAL
  Filled 2015-11-24: qty 0.25

## 2015-11-24 MED ORDER — DIPHENHYDRAMINE HCL 50 MG/ML IJ SOLN
12.5000 mg | INTRAMUSCULAR | Status: DC | PRN
  Administered 2015-11-24 (×2): 12.5 mg via INTRAVENOUS
  Filled 2015-11-24: qty 1

## 2015-11-24 MED ORDER — OXYCODONE-ACETAMINOPHEN 5-325 MG PO TABS
1.0000 | ORAL_TABLET | ORAL | Status: DC | PRN
Start: 2015-11-24 — End: 2015-11-25

## 2015-11-24 MED ORDER — OXYTOCIN 40 UNITS IN LACTATED RINGERS INFUSION - SIMPLE MED
1.0000 m[IU]/min | INTRAVENOUS | Status: DC
  Administered 2015-11-24: 2 m[IU]/min via INTRAVENOUS
  Administered 2015-11-25: 26 m[IU]/min via INTRAVENOUS
  Filled 2015-11-24: qty 1000

## 2015-11-24 MED ORDER — LACTATED RINGERS IV SOLN
500.0000 mL | Freq: Once | INTRAVENOUS | Status: AC
  Administered 2015-11-24: 500 mL via INTRAVENOUS

## 2015-11-24 MED ORDER — LACTATED RINGERS IV SOLN
500.0000 mL | INTRAVENOUS | Status: DC | PRN
  Administered 2015-11-25: 500 mL via INTRAVENOUS

## 2015-11-24 MED ORDER — LACTATED RINGERS IV SOLN: 500.0000 mL | Freq: Once | INTRAVENOUS | Status: DC

## 2015-11-24 MED ORDER — PHENYLEPHRINE 40 MCG/ML (10ML) SYRINGE FOR IV PUSH (FOR BLOOD PRESSURE SUPPORT): 80.0000 ug | PREFILLED_SYRINGE | INTRAVENOUS | Status: DC | PRN

## 2015-11-24 MED ORDER — FENTANYL CITRATE (PF) 100 MCG/2ML IJ SOLN
100.0000 ug | INTRAMUSCULAR | Status: DC | PRN
Start: 2015-11-24 — End: 2015-11-25
  Administered 2015-11-24 (×2): 100 ug via INTRAVENOUS
  Filled 2015-11-24 (×2): qty 2

## 2015-11-24 MED ORDER — SOD CITRATE-CITRIC ACID 500-334 MG/5ML PO SOLN
30.0000 mL | ORAL | Status: DC | PRN
  Administered 2015-11-25: 30 mL via ORAL
  Filled 2015-11-24: qty 15

## 2015-11-24 MED ORDER — LIDOCAINE HCL (PF) 1 % IJ SOLN
INTRAMUSCULAR | Status: DC | PRN
  Administered 2015-11-24: 4 mL

## 2015-11-24 MED ORDER — PHENYLEPHRINE 40 MCG/ML (10ML) SYRINGE FOR IV PUSH (FOR BLOOD PRESSURE SUPPORT)
80.0000 ug | PREFILLED_SYRINGE | INTRAVENOUS | Status: DC | PRN
  Filled 2015-11-24 (×3): qty 10

## 2015-11-24 MED ORDER — PHENYLEPHRINE 40 MCG/ML (10ML) SYRINGE FOR IV PUSH (FOR BLOOD PRESSURE SUPPORT)
80.0000 ug | PREFILLED_SYRINGE | INTRAVENOUS | Status: DC | PRN
  Filled 2015-11-24: qty 10

## 2015-11-24 MED ORDER — LIDOCAINE HCL (PF) 1 % IJ SOLN
30.0000 mL | INTRAMUSCULAR | Status: DC | PRN
  Filled 2015-11-24: qty 30

## 2015-11-24 MED ORDER — OXYTOCIN 40 UNITS IN LACTATED RINGERS INFUSION - SIMPLE MED
2.5000 [IU]/h | INTRAVENOUS | Status: DC
  Filled 2015-11-24: qty 1000

## 2015-11-24 MED ORDER — ACETAMINOPHEN 325 MG PO TABS
650.0000 mg | ORAL_TABLET | ORAL | Status: DC | PRN
  Administered 2015-11-25: 650 mg via ORAL
  Filled 2015-11-24: qty 2

## 2015-11-24 MED ORDER — LACTATED RINGERS IV SOLN
INTRAVENOUS | Status: DC
  Administered 2015-11-24 – 2015-11-25 (×3): via INTRAVENOUS

## 2015-11-24 NOTE — Anesthesia Procedure Notes (Signed)
Epidural Patient location during procedure: OB Start time: 11/24/2015 4:16 PM End time: 11/24/2015 4:23 PM  Staffing Anesthesiologist: Shona SimpsonHOLLIS, Nassir Neidert D Performed: anesthesiologist   Preanesthetic Checklist Completed: patient identified, site marked, surgical consent, pre-op evaluation, timeout performed, IV checked, risks and benefits discussed and monitors and equipment checked  Epidural Patient position: sitting Prep: ChloraPrep Patient monitoring: heart rate, continuous pulse ox and blood pressure Approach: midline Location: L3-L4 Injection technique: LOR saline  Needle:  Needle type: Tuohy  Needle gauge: 17 G Needle length: 9 cm Catheter type: closed end flexible Catheter size: 20 Guage Test dose: negative and 1.5% lidocaine  Assessment Events: blood not aspirated, injection not painful, no injection resistance and no paresthesia  Additional Notes LOR @ 6.5  Patient identified. Risks/Benefits/Options discussed with patient including but not limited to bleeding, infection, nerve damage, paralysis, failed block, incomplete pain control, headache, blood pressure changes, nausea, vomiting, reactions to medications, itching and postpartum back pain. Confirmed with bedside nurse the patient's most recent platelet count. Confirmed with patient that they are not currently taking any anticoagulation, have any bleeding history or any family history of bleeding disorders. Patient expressed understanding and wished to proceed. All questions were answered. Sterile technique was used throughout the entire procedure. Please see nursing notes for vital signs. Test dose was given through epidural catheter and negative prior to continuing to dose epidural or start infusion. Warning signs of high block given to the patient including shortness of breath, tingling/numbness in hands, complete motor block, or any concerning symptoms with instructions to call for help. Patient was given instructions on  fall risk and not to get out of bed. All questions and concerns addressed with instructions to call with any issues or inadequate analgesia.    Reason for block:procedure for pain

## 2015-11-24 NOTE — Anesthesia Pain Management Evaluation Note (Signed)
  [facility] Pain Management Visit Note  Patient: Carmen Lewis, 40 y.o., female  "Hello I am a [facility]. We have an anesthesia team available at all times to provide care throughout the hospital, including epidural management and anesthesia for C-section. I don't know your plan for the delivery whether it a natural birth, water birth, IV sedation, nitrous supplementation, doula or epidural, but we want to meet your pain goals."   1.Was your pain managed to your expectations on prior hospitalizations?   Yes   2.What is your expectation for pain management during this hospitalization?     Epidural  3.How can we help you reach that goal? Support prn  Record the patient's initial score and the patient's pain goal.   Pain: 0  Pain Goal: 7 [facility] wants you to be able to say your pain was always managed very well.  [facility] HospitalWRINKLE,Kaycen Whitworth 11/24/2015

## 2015-11-24 NOTE — H&P (Signed)
LABOR AND DELIVERY ADMISSION HISTORY AND PHYSICAL NOTE  Carmen Lewis is a [age] y.o. female [phone] with IUP at 1128w0d by LMP consistent with 6wk ultrasound presenting for IOL for timing of delivery on anticoagulation on lovenox   She reports positive fetal movement. She denies leakage of fluid or vaginal bleeding.  Prenatal History/Complications:  Past Medical History: Past Medical History:  Diagnosis Date  . Contraceptive management   . DVT (deep venous thrombosis) (HCC) 07/2010   right leg  . Headache disorder   . Osteoporosis    LEFT HIP    Past Surgical History: Past Surgical History:  Procedure Laterality Date  . NO PAST SURGERIES      Obstetrical History: OB History    Gravida Para Term Preterm AB Living   5 2 2   2 2    SAB TAB Ectopic Multiple Live Births   1 1     2       Social History: Social History   Social History  . Marital status: Single    Spouse name: N/A  . Number of children: N/A  . Years of education: N/A   Social History Main Topics  . Smoking status: Never Smoker  . Smokeless tobacco: Never Used  . Alcohol use No  . Drug use: No  . Sexual activity: Yes    Birth control/ protection: None   Other Topics Concern  . None   Social History Narrative  . None    Family History: Family History  Problem Relation Age of Onset  . Hypertension Father   . Hypertension Maternal Aunt   . Hypertension Paternal Uncle   . Hypertension Maternal Grandmother   . Hypertension Maternal Grandfather   . Hypertension Paternal Grandmother   . Diabetes Mother     Allergies: Allergies  Allergen Reactions  . Latex Itching    Prescriptions Prior to Admission  Medication Sig Dispense Refill Last Dose  . acetaminophen (TYLENOL) 325 MG tablet Take 650 mg by mouth every 6 (six) hours as needed for mild pain, moderate pain or headache.   Past Week at Unknown time  . enoxaparin (LOVENOX) 40 MG/0.4ML injection Inject 40 mg into the skin at bedtime.     11/22/2015 at 2230     Review of Systems   All systems reviewed and negative except as stated in HPI  Blood pressure 113/71, pulse 87, temperature 98.3 F (36.8 C), temperature source Oral, resp. rate 18, height 5\' 4"  (1.626 m), weight 221 lb (100.2 kg), last menstrual period 02/28/2015. General appearance: alert, cooperative and appears stated age Lungs: clear to auscultation bilaterally Heart: regular rate and rhythm Abdomen: soft, non-tender; bowel sounds normal Extremities: No calf swelling or tenderness Presentation: cephalic by my exam Fetal monitoring: category 1 Uterine activity: none Dilation: 1 Effacement (%): 50 Station: -3 Exam by:: h stone rnc   Prenatal labs: ABO, Rh: --/--/O POS (11/18 0740) Antibody: NEG (11/18 0740) Rubella: !Error! RPR: Non Reactive (09/05 1200)  HBsAg: Negative (07/13 1013)  HIV: Non Reactive (09/05 1200)  GBS: Positive (11/18 0000)  2 hr Glucola: normal Genetic screening:  normal Anatomy US: normal  Prenatal Transfer Tool  Maternal Diabetes: No Genetic Screening: Normal Maternal Ultrasounds/Referrals: Normal Fetal Ultrasounds or other Referrals:  None Maternal Substance Abuse:  No Significant Maternal Medications:  None Significant Maternal Lab Results: Lab values include: Group B Strep positive  Results for orders placed or performed during the hospital encounter of 11/24/15 (from the past 24 hour(s))  OB RESULT  CONSOLE Group B Strep   Collection Time: 11/24/15 12:00 AM  Result Value Ref Range   GBS Positive   CBC   Collection Time: 11/24/15  7:40 AM  Result Value Ref Range   WBC 11.2 (H) 4.0 - 10.5 K/uL   RBC 3.69 (L) 3.87 - 5.11 MIL/uL   Hemoglobin 11.0 (L) 12.0 - 15.0 g/dL   HCT 52.832.2 (L) 41.336.0 - 24.446.0 %   MCV 87.3 78.0 - 100.0 fL   MCH 29.8 26.0 - 34.0 pg   MCHC 34.2 30.0 - 36.0 g/dL   RDW 01.015.4 27.211.5 - 53.615.5 %   Platelets 261 150 - 400 K/uL  Type and screen   Collection Time: 11/24/15  7:40 AM  Result Value Ref Range    ABO/RH(D) O POS    Antibody Screen NEG    Sample Expiration 11/27/2015     Patient Active Problem List   Diagnosis Date Noted  . History of maternal deep vein thrombosis (DVT) 11/24/2015  . Group beta Strep positive 10/23/2015  . Request for sterilization 09/11/2015  . Supervision of high risk pregnancy, antepartum 08/21/2015  . History of DVT (deep vein thrombosis) 08/21/2015  . Advanced maternal age in multigravida 07/06/2015    Assessment: Carmen Hornlicia J Bogdanski is a 40 y.o. [id number] at 5711w0d here for IOL for timing of delivery on anticoagulation  #Labor: IOL with cytotec, foley, then pitocin #Pain: Plans for epidural #FWB: Category 1 #ID:  GBS positive PCN #MOF: breast and bottle #MOC: BTL papers signed 09/11/2015 #Circ:  n/a  Ernestina PennaNicholas Schenk 11/24/2015, 9:23 AM

## 2015-11-24 NOTE — Anesthesia Preprocedure Evaluation (Signed)
Anesthesia Evaluation  Patient identified by MRN, date of birth, ID band Patient awake    Reviewed: Allergy & Precautions, Patient's Chart, lab work & pertinent test results  Airway Mallampati: II       Dental  (+) Teeth Intact   Pulmonary neg pulmonary ROS,    breath sounds clear to auscultation       Cardiovascular negative cardio ROS   Rhythm:Regular Rate:Normal     Neuro/Psych  Headaches, negative psych ROS   GI/Hepatic negative GI ROS, Neg liver ROS,   Endo/Other  negative endocrine ROS  Renal/GU negative Renal ROS  negative genitourinary   Musculoskeletal negative musculoskeletal ROS (+)   Abdominal   Peds negative pediatric ROS (+)  Hematology negative hematology ROS (+)   Anesthesia Other Findings   Reproductive/Obstetrics (+) Pregnancy                             Lab Results  Component Value Date   WBC 11.2 (H) 11/24/2015   HGB 11.0 (L) 11/24/2015   HCT 32.2 (L) 11/24/2015   MCV 87.3 11/24/2015   PLT 261 11/24/2015   Lab Results  Component Value Date   INR 1.01 03/30/2013   INR 1.45 12/03/2010   INR 1.54 (H) 11/18/2010     Anesthesia Physical Anesthesia Plan  ASA: II  Anesthesia Plan: Epidural   Post-op Pain Management:    Induction:   Airway Management Planned:   Additional Equipment:   Intra-op Plan:   Post-operative Plan:   Informed Consent: I have reviewed the patients History and Physical, chart, labs and discussed the procedure including the risks, benefits and alternatives for the proposed anesthesia with the patient or authorized representative who has indicated his/her understanding and acceptance.     Plan Discussed with:   Anesthesia Plan Comments: (No lovenox > 24 hrs.  H/o DVT)        Anesthesia Quick Evaluation

## 2015-11-24 NOTE — Progress Notes (Signed)
Patient ID: Carmen Lewis, female   DOB: [birth year], 40 y.o.   MRN: [id number] Attempted AROM x 4 without success. Attempted FSE placement. Presenting part could not be easily felt. vtx confirmed by u/s. Will attempt to sit patient up and allow gravity to bring vtx down then re-attempt AROM.

## 2015-11-25 ENCOUNTER — Inpatient Hospital Stay (HOSPITAL_COMMUNITY): Payer: Medicaid Other | Admitting: Anesthesiology

## 2015-11-25 ENCOUNTER — Encounter (HOSPITAL_COMMUNITY)
Admission: RE | Disposition: A | Discharge status: Home / Self Care | Payer: Self-pay | Source: Ambulatory Visit | Attending: Obstetrics & Gynecology

## 2015-11-25 ENCOUNTER — Encounter (HOSPITAL_COMMUNITY): Payer: Self-pay

## 2015-11-25 DIAGNOSIS — D68318 Other hemorrhagic disorder due to intrinsic circulating anticoagulants, antibodies, or inhibitors: Secondary | ICD-10-CM

## 2015-11-25 DIAGNOSIS — Z86718 Personal history of other venous thrombosis and embolism: Secondary | ICD-10-CM

## 2015-11-25 DIAGNOSIS — O99824 Streptococcus B carrier state complicating childbirth: Secondary | ICD-10-CM

## 2015-11-25 DIAGNOSIS — Z7901 Long term (current) use of anticoagulants: Secondary | ICD-10-CM

## 2015-11-25 DIAGNOSIS — Z3A39 39 weeks gestation of pregnancy: Secondary | ICD-10-CM

## 2015-11-25 DIAGNOSIS — O9912 Other diseases of the blood and blood-forming organs and certain disorders involving the immune mechanism complicating childbirth: Secondary | ICD-10-CM

## 2015-11-25 DIAGNOSIS — Z302 Encounter for sterilization: Secondary | ICD-10-CM

## 2015-11-25 HISTORY — PX: TUBAL LIGATION: SHX77

## 2015-11-25 LAB — RPR: RPR Ser Ql: NONREACTIVE

## 2015-11-25 LAB — PREPARE RBC (CROSSMATCH)

## 2015-11-25 SURGERY — LIGATION, FALLOPIAN TUBE, POSTPARTUM
Anesthesia: Epidural | Site: Abdomen | Laterality: Bilateral

## 2015-11-25 MED ORDER — LANOLIN HYDROUS EX OINT: TOPICAL_OINTMENT | CUTANEOUS | Status: DC | PRN

## 2015-11-25 MED ORDER — PRENATAL MULTIVITAMIN CH
1.0000 | ORAL_TABLET | Freq: Every day | ORAL | Status: DC
  Administered 2015-11-26: 1 via ORAL
  Filled 2015-11-25: qty 1

## 2015-11-25 MED ORDER — SODIUM CHLORIDE 0.9 % IV SOLN: Freq: Once | INTRAVENOUS | Status: DC

## 2015-11-25 MED ORDER — MEPERIDINE HCL 25 MG/ML IJ SOLN: 6.2500 mg | INTRAMUSCULAR | Status: DC | PRN

## 2015-11-25 MED ORDER — IBUPROFEN 600 MG PO TABS
600.0000 mg | ORAL_TABLET | Freq: Four times a day (QID) | ORAL | Status: DC
  Administered 2015-11-25 – 2015-11-27 (×6): 600 mg via ORAL
  Filled 2015-11-25 (×6): qty 1

## 2015-11-25 MED ORDER — SODIUM CHLORIDE 0.9 % IV SOLN
2.0000 g | Freq: Four times a day (QID) | INTRAVENOUS | Status: DC
  Administered 2015-11-25 (×2): 2 g via INTRAVENOUS
  Filled 2015-11-25 (×3): qty 2000

## 2015-11-25 MED ORDER — MEASLES, MUMPS & RUBELLA VAC ~~LOC~~ INJ
0.5000 mL | INJECTION | Freq: Once | SUBCUTANEOUS | Status: DC
Start: 2015-11-26 — End: 2015-11-25
  Filled 2015-11-25: qty 0.5

## 2015-11-25 MED ORDER — METOCLOPRAMIDE HCL 5 MG/ML IJ SOLN: 10.0000 mg | Freq: Once | INTRAMUSCULAR | Status: DC | PRN

## 2015-11-25 MED ORDER — ZOLPIDEM TARTRATE 5 MG PO TABS: 5.0000 mg | ORAL_TABLET | Freq: Every evening | ORAL | Status: DC | PRN

## 2015-11-25 MED ORDER — SODIUM CHLORIDE 0.9% FLUSH: 3.0000 mL | INTRAVENOUS | Status: DC | PRN

## 2015-11-25 MED ORDER — BUPIVACAINE HCL (PF) 0.25 % IJ SOLN
INTRAMUSCULAR | Status: AC
  Filled 2015-11-25: qty 30

## 2015-11-25 MED ORDER — SENNOSIDES-DOCUSATE SODIUM 8.6-50 MG PO TABS
2.0000 | ORAL_TABLET | ORAL | Status: DC
  Administered 2015-11-26 (×2): 2 via ORAL
  Filled 2015-11-25 (×2): qty 2

## 2015-11-25 MED ORDER — BUPIVACAINE HCL (PF) 0.25 % IJ SOLN
INTRAMUSCULAR | Status: DC | PRN
  Administered 2015-11-25: 10 mL

## 2015-11-25 MED ORDER — BUPIVACAINE HCL (PF) 0.25 % IJ SOLN
INTRAMUSCULAR | Status: DC | PRN
  Administered 2015-11-25: 8 mL via EPIDURAL
  Administered 2015-11-25: 4 mL via EPIDURAL
  Administered 2015-11-25 (×2): 5 mL via EPIDURAL
  Administered 2015-11-25: 4 mL via EPIDURAL

## 2015-11-25 MED ORDER — ONDANSETRON HCL 4 MG/2ML IJ SOLN
INTRAMUSCULAR | Status: DC | PRN
  Administered 2015-11-25: 4 mg via INTRAVENOUS

## 2015-11-25 MED ORDER — LIDOCAINE-EPINEPHRINE (PF) 2 %-1:200000 IJ SOLN
INTRAMUSCULAR | Status: AC
  Filled 2015-11-25: qty 20

## 2015-11-25 MED ORDER — BENZOCAINE-MENTHOL 20-0.5 % EX AERO: 1.0000 "application " | INHALATION_SPRAY | CUTANEOUS | Status: DC | PRN

## 2015-11-25 MED ORDER — WITCH HAZEL-GLYCERIN EX PADS
1.0000 | MEDICATED_PAD | CUTANEOUS | Status: DC | PRN
Start: 2015-11-25 — End: 2015-11-27

## 2015-11-25 MED ORDER — TETANUS-DIPHTH-ACELL PERTUSSIS 5-2.5-18.5 LF-MCG/0.5 IM SUSP: 0.5000 mL | Freq: Once | INTRAMUSCULAR | Status: DC

## 2015-11-25 MED ORDER — SIMETHICONE 80 MG PO CHEW: 80.0000 mg | CHEWABLE_TABLET | ORAL | Status: DC | PRN

## 2015-11-25 MED ORDER — DIPHENHYDRAMINE HCL 25 MG PO CAPS: 25.0000 mg | ORAL_CAPSULE | Freq: Four times a day (QID) | ORAL | Status: DC | PRN

## 2015-11-25 MED ORDER — SODIUM CHLORIDE 0.9 % IV SOLN: 250.0000 mL | INTRAVENOUS | Status: DC | PRN

## 2015-11-25 MED ORDER — ACETAMINOPHEN 325 MG PO TABS
650.0000 mg | ORAL_TABLET | ORAL | Status: DC | PRN
  Administered 2015-11-26 – 2015-11-27 (×3): 650 mg via ORAL
  Filled 2015-11-25 (×3): qty 2

## 2015-11-25 MED ORDER — OXYCODONE HCL 5 MG PO TABS
5.0000 mg | ORAL_TABLET | Freq: Four times a day (QID) | ORAL | Status: AC | PRN
  Administered 2015-11-25 – 2015-11-26 (×3): 5 mg via ORAL
  Filled 2015-11-25 (×4): qty 1

## 2015-11-25 MED ORDER — ONDANSETRON HCL 4 MG/2ML IJ SOLN
INTRAMUSCULAR | Status: AC
  Filled 2015-11-25: qty 2

## 2015-11-25 MED ORDER — LACTATED RINGERS IV SOLN
INTRAVENOUS | Status: DC | PRN
  Administered 2015-11-25: 19:00:00 via INTRAVENOUS

## 2015-11-25 MED ORDER — SODIUM CHLORIDE 0.9% FLUSH: 3.0000 mL | Freq: Two times a day (BID) | INTRAVENOUS | Status: DC

## 2015-11-25 MED ORDER — ONDANSETRON HCL 4 MG/2ML IJ SOLN: 4.0000 mg | INTRAMUSCULAR | Status: DC | PRN

## 2015-11-25 MED ORDER — ONDANSETRON HCL 4 MG PO TABS: 4.0000 mg | ORAL_TABLET | ORAL | Status: DC | PRN

## 2015-11-25 MED ORDER — COCONUT OIL OIL: 1.0000 "application " | TOPICAL_OIL | Status: DC | PRN

## 2015-11-25 MED ORDER — FENTANYL CITRATE (PF) 100 MCG/2ML IJ SOLN
INTRAMUSCULAR | Status: DC | PRN
  Administered 2015-11-25: 100 ug via EPIDURAL

## 2015-11-25 MED ORDER — SODIUM BICARBONATE 8.4 % IV SOLN
INTRAVENOUS | Status: DC | PRN
  Administered 2015-11-25 (×2): 5 mL via EPIDURAL

## 2015-11-25 MED ORDER — DIBUCAINE 1 % RE OINT: 1.0000 "application " | TOPICAL_OINTMENT | RECTAL | Status: DC | PRN

## 2015-11-25 MED ORDER — DEXTROSE 5 % IV SOLN
140.0000 mg | Freq: Three times a day (TID) | INTRAVENOUS | Status: DC
  Administered 2015-11-25: 140 mg via INTRAVENOUS
  Filled 2015-11-25 (×3): qty 3.5

## 2015-11-25 MED ORDER — FENTANYL CITRATE (PF) 100 MCG/2ML IJ SOLN
INTRAMUSCULAR | Status: AC
  Filled 2015-11-25: qty 2

## 2015-11-25 MED ORDER — FENTANYL CITRATE (PF) 100 MCG/2ML IJ SOLN: 25.0000 ug | INTRAMUSCULAR | Status: DC | PRN

## 2015-11-25 MED ORDER — ENOXAPARIN SODIUM 40 MG/0.4ML ~~LOC~~ SOLN
40.0000 mg | SUBCUTANEOUS | Status: DC
  Administered 2015-11-26: 40 mg via SUBCUTANEOUS
  Filled 2015-11-25 (×2): qty 0.4

## 2015-11-25 MED ORDER — LIDOCAINE-EPINEPHRINE (PF) 2 %-1:200000 IJ SOLN
INTRAMUSCULAR | Status: DC | PRN
  Administered 2015-11-25: 4 mL

## 2015-11-25 MED ORDER — LIDOCAINE-EPINEPHRINE (PF) 2 %-1:200000 IJ SOLN
INTRAMUSCULAR | Status: DC | PRN
  Administered 2015-11-25: 10 mL via EPIDURAL
  Administered 2015-11-25: 3 mL via EPIDURAL
  Administered 2015-11-25: 2 mL via EPIDURAL
  Administered 2015-11-25: 5 mL via EPIDURAL

## 2015-11-25 SURGICAL SUPPLY — 25 items
CLOTH BEACON ORANGE TIMEOUT ST (SAFETY) ×2 IMPLANT
DRSG OPSITE POSTOP 3X4 (GAUZE/BANDAGES/DRESSINGS) ×2 IMPLANT
DURAPREP 26ML APPLICATOR (WOUND CARE) ×2 IMPLANT
GLOVE BIO SURGEON STRL SZ7 (GLOVE) IMPLANT
GLOVE BIOGEL PI IND STRL 7.0 (GLOVE) ×2 IMPLANT
GLOVE BIOGEL PI INDICATOR 7.0 (GLOVE) ×2
GLOVE SKINSENSE NS SZ6.5 (GLOVE) ×1
GLOVE SKINSENSE NS SZ7.0 (GLOVE) ×1
GLOVE SKINSENSE STRL SZ6.5 (GLOVE) ×1 IMPLANT
GLOVE SKINSENSE STRL SZ7.0 (GLOVE) ×1 IMPLANT
GOWN STRL REUS W/TWL LRG LVL3 (GOWN DISPOSABLE) ×4 IMPLANT
NEEDLE HYPO 22GX1.5 SAFETY (NEEDLE) ×2 IMPLANT
NS IRRIG 1000ML POUR BTL (IV SOLUTION) ×2 IMPLANT
PACK ABDOMINAL MINOR (CUSTOM PROCEDURE TRAY) ×2 IMPLANT
PROTECTOR NERVE ULNAR (MISCELLANEOUS) ×2 IMPLANT
SPONGE LAP 4X18 X RAY DECT (DISPOSABLE) ×2 IMPLANT
SUT VIC AB 0 CT1 27 (SUTURE) ×2
SUT VIC AB 0 CT1 27XBRD ANBCTR (SUTURE) ×2 IMPLANT
SUT VICRYL 4-0 PS2 18IN ABS (SUTURE) ×2 IMPLANT
SYR CONTROL 10ML LL (SYRINGE) ×2 IMPLANT
TOWEL OR 17X24 6PK STRL BLUE (TOWEL DISPOSABLE) ×4 IMPLANT
TRAY FOLEY BAG SILVER LF 16FR (CATHETERS) ×2 IMPLANT
TRAY FOLEY CATH SILVER 14FR (SET/KITS/TRAYS/PACK) IMPLANT
WATER STERILE IRR 1000ML POUR (IV SOLUTION) IMPLANT
filshie clip ×2 IMPLANT

## 2015-11-25 NOTE — Progress Notes (Signed)
Carmen Lewis is a 40 y.o. [id number] at 326w1d by LMP admitted for induction of labor due to timing of delivery on anticoagulation on lovenox.  Subjective:   Objective: BP (!) 109/59   Pulse 85   Temp 100 F (37.8 C) (Axillary)   Resp 18   Ht 5\' 4"  (1.626 m)   Wt 221 lb (100.2 kg)   LMP 02/28/2015 (Approximate)   SpO2 98%   BMI 37.93 kg/m  I/O last 3 completed shifts: In: -  Out: 150 [Urine:150] Total I/O In: -  Out: 50 [Urine:50]  FHT:  FHR: 150's bpm, variability: moderate,  accelerations:  Present,  decelerations:  Absent UC:   regular, every 1-3 minutes SVE:   Dilation: 6 Effacement (%): 80 Station: -2 Exam by:: Carmen RobinsonHarmony Stone, RN  Labs: Lab Results  Component Value Date   WBC 11.2 (H) 11/24/2015   HGB 11.0 (L) 11/24/2015   HCT 32.2 (L) 11/24/2015   MCV 87.3 11/24/2015   PLT 261 11/24/2015    Assessment / Plan: IOL, on oxytocin  Labor: Progressing normally Preeclampsia:  no signs or symptoms of toxicity, intake and ouput balanced and labs stable Fetal Wellbeing:  Category I Pain Control:  Epidural I/D:  n/a Anticipated MOD:  NSVD  Carmen Lewis 11/25/2015, 10:53 AM

## 2015-11-25 NOTE — Progress Notes (Signed)
Placed IUPC, as external monitor not picking up contractions. Cervix still 6/70% but -2.

## 2015-11-25 NOTE — Progress Notes (Signed)
Patient seen. Checked Cervix 6/50/-3. Presenting part felt to be head. AROM for pink tinged fluid. Baby remain very high but is no engaged in pelvis.

## 2015-11-25 NOTE — Transfer of Care (Signed)
Immediate Anesthesia Transfer of Care Note  Patient: Carmen Lewis  Procedure(s) Performed: Procedure(s): POST PARTUM TUBAL LIGATION (Bilateral)  Patient Location: PACU  Anesthesia Type:Epidural  Level of Consciousness: awake  Airway & Oxygen Therapy: Patient Spontanous Breathing  Post-op Assessment: Report given to RN and Post -op Vital signs reviewed and stable  Post vital signs: stable  Last Vitals:  Vitals:   11/25/15 1830 11/25/15 1945  BP: 139/65   Pulse: 74 (P) 91  Resp:  (!) (P) 21  Temp:      Last Pain:  Vitals:   11/25/15 1800  TempSrc: Oral  PainSc:          Complications: No apparent anesthesia complications

## 2015-11-25 NOTE — Op Note (Signed)
Carmen Lewis 11/24/2015 - 11/25/2015  PREOPERATIVE DIAGNOSIS:  Multiparity, undesired fertility  POSTOPERATIVE DIAGNOSIS:  Multiparity, undesired fertility  PROCEDURE:  Postpartum Bilateral Tubal Sterilization using Filshie Clips, removal of left paratubal cyst   ANESTHESIA:  Epidural and local analgesia using 0.25% Bupivicaine  COMPLICATIONS:  None immediate.  ESTIMATED BLOOD LOSS:  10 ml.  INDICATIONS: 40 y.o. [id number]  with undesired fertility,status post vaginal delivery, desires permanent sterilization.  Other reversible forms of contraception were discussed with patient; she declines all other modalities. Risks of procedure discussed with patient including but not limited to: risk of regret, permanence of method, bleeding, infection, injury to surrounding organs and need for additional procedures.  Failure risk of 0.5-1% with increased risk of ectopic gestation if pregnancy occurs was also discussed with patient.     FINDINGS:  Normal uterus, paratubal cyst on left fallopian tube, normal right fallopian tube tubes, and normal ovaries.  PROCEDURE DETAILS: The patient was taken to the operating room where her epidural anesthesia was dosed up to surgical level and found to be adequate.  She was then placed in the dorsal supine position and prepped and draped in sterile fashion.  After an adequate timeout was performed, attention was turned to the patient's abdomen where a small umbilical skin incision was made. The incision was taken down to the layer of fascia using the scalpel, and fascia was incised, and extended bilaterally using Mayo scissors. The peritoneum was entered in a sharp fashion. Attention was then turned to the patient's uterus, and left fallopian tube was identified and followed out to the fimbriated end.  A Filshie clip was placed on the left fallopian tube about 3 cm from the cornual attachment, with care given to incorporate the underlying mesosalpinx.  There was a  pedunculated paratubal cyst on the left fallopian tube which was removed and stalk suture ligated with 0-vicryl.  A similar process was carried out on the right side allowing for bilateral tubal sterilization.  Good hemostasis was noted overall.The instruments were then removed from the patient's abdomen and the fascial incision was repaired with 0 Vicryl, and the skin was closed with a 4-0 Vicryl subcuticular stitch. Ten cc of 25% bupivacaine was injected into the subcutaneous tissue.  The patient tolerated the procedure well.  Instrument, sponge, and needle counts were correct times two.  The patient was then taken to the recovery room awake and in stable condition.  LEGGETT,KELLY H. 11/25/2015 7:47 PM

## 2015-11-25 NOTE — Progress Notes (Signed)
Patient ID: Carmen Lewis, female   DOB: 06-09-1975, 40 y.o.   MRN: [id number]   Patient desires permanent sterilization.  Other reversible forms of contraception were discussed with patient; she declines all other modalities. Risks of procedure discussed with patient including but not limited to: risk of regret, permanence of method, bleeding, infection, injury to surrounding organs and need for additional procedures.  Failure risk of 0.5-1% with increased risk of ectopic gestation if pregnancy occurs was also discussed with patient.    Patient verbalized understanding of these risks and wants to proceed with sterilization.  Written informed consent obtained.  To OR when ready.

## 2015-11-25 NOTE — Progress Notes (Signed)
Pharmacy Antibiotic Note  Carmen Lewis is a 40 y.o. female admitted on 11/24/2015 for induction of labor.  Pharmacy has been consulted for gentamicin dosing for presumed Triple I.  Plan: Gentamicin 140 mg IV Q8hr Levels if indicated  Height: 5\' 4"  (162.6 cm) Weight: 221 lb (100.2 kg) IBW/kg (Calculated) : 54.7 kg Dosing weight: 68 kg  Temp (24hrs), Avg:99.4 F (37.4 C), Min:97.9 F (36.6 C), Max:101 F (38.3 C)   Recent Labs Lab 11/24/15 0740  WBC 11.2*    CrCl cannot be calculated (Patient's most recent lab result is older than the maximum 21 days allowed.).    Allergies  Allergen Reactions  . Latex Itching    Antimicrobials this admission: Penicillin 11/24/15 >> 11/25/15 Ampicillin 11/25/15 >> Gentamicin 11/25/15 >>    Thank you for allowing pharmacy to be a part of this patient's care.  Natasha BenceCline, Javan Gonzaga 11/25/2015 8:57 AM

## 2015-11-25 NOTE — Anesthesia Postprocedure Evaluation (Signed)
Anesthesia Post Note  Patient: Carmen Lewis  Procedure(s) Performed: Procedure(s) (LRB): POST PARTUM TUBAL LIGATION, Excision Left Paratubal Cyst (Bilateral)  Patient location during evaluation: PACU Anesthesia Type: Epidural Level of consciousness: awake and alert and oriented Pain management: pain level controlled Vital Signs Assessment: post-procedure vital signs reviewed and stable Respiratory status: spontaneous breathing, respiratory function stable and nonlabored ventilation Cardiovascular status: blood pressure returned to baseline and stable Postop Assessment: epidural receding, no backache, no headache, patient able to bend at knees and no signs of nausea or vomiting Anesthetic complications: no     Last Vitals:  Vitals:   11/25/15 2030 11/25/15 2045  BP: 112/69 120/74  Pulse: 82 80  Resp: 20 (!) 24  Temp:  37.1 C    Last Pain:  Vitals:   11/25/15 2045  TempSrc:   PainSc: 2    Pain Goal:                 Zhuri Krass A.

## 2015-11-25 NOTE — Anesthesia Preprocedure Evaluation (Addendum)
Anesthesia Evaluation  Patient identified by MRN, date of birth, ID band Patient awake    Reviewed: Allergy & Precautions, NPO status , Patient's Chart, lab work & pertinent test results  Airway Mallampati: II       Dental  (+) Teeth Intact   Pulmonary neg pulmonary ROS,    breath sounds clear to auscultation       Cardiovascular + Peripheral Vascular Disease   Rhythm:Regular Rate:Normal  Hx/o DVT during pregnancy on lovenox- last dose 11/22/2015   Neuro/Psych  Headaches, negative psych ROS   GI/Hepatic negative GI ROS, Neg liver ROS,   Endo/Other  Obesity  Renal/GU negative Renal ROS  negative genitourinary   Musculoskeletal Hx/o osteoporosis   Abdominal   Peds negative pediatric ROS (+)  Hematology  (+) anemia , On lovenox- last dose 11/22/2015   Anesthesia Other Findings   Reproductive/Obstetrics Desires sterilization AMA                            Lab Results  Component Value Date   WBC 11.2 (H) 11/24/2015   HGB 11.0 (L) 11/24/2015   HCT 32.2 (L) 11/24/2015   MCV 87.3 11/24/2015   PLT 261 11/24/2015   Lab Results  Component Value Date   INR 1.01 03/30/2013   INR 1.45 12/03/2010   INR 1.54 (H) 11/18/2010     Anesthesia Physical  Anesthesia Plan  ASA: II  Anesthesia Plan: Epidural   Post-op Pain Management:    Induction:   Airway Management Planned: Natural Airway  Additional Equipment:   Intra-op Plan:   Post-operative Plan:   Informed Consent: I have reviewed the patients History and Physical, chart, labs and discussed the procedure including the risks, benefits and alternatives for the proposed anesthesia with the patient or authorized representative who has indicated his/her understanding and acceptance.     Plan Discussed with: CRNA, Anesthesiologist and Surgeon  Anesthesia Plan Comments:         Anesthesia Quick Evaluation

## 2015-11-26 ENCOUNTER — Encounter (HOSPITAL_COMMUNITY): Payer: Self-pay | Admitting: Obstetrics & Gynecology

## 2015-11-26 MED ORDER — OXYCODONE-ACETAMINOPHEN 5-325 MG PO TABS
1.0000 | ORAL_TABLET | ORAL | Status: DC | PRN
  Administered 2015-11-27: 1 via ORAL
  Filled 2015-11-26: qty 1

## 2015-11-26 MED ORDER — OXYCODONE HCL 5 MG PO TABS
5.0000 mg | ORAL_TABLET | Freq: Once | ORAL | Status: AC
  Administered 2015-11-26: 5 mg via ORAL
  Filled 2015-11-26: qty 1

## 2015-11-26 NOTE — Anesthesia Postprocedure Evaluation (Signed)
Anesthesia Post Note  Patient: Carmen Lewis  Procedure(s) Performed: * No procedures listed *  Patient location during evaluation: Mother Baby Anesthesia Type: Epidural Level of consciousness: awake and alert Pain management: pain level controlled Vital Signs Assessment: post-procedure vital signs reviewed and stable Respiratory status: spontaneous breathing, nonlabored ventilation and respiratory function stable Cardiovascular status: stable Postop Assessment: no headache, no backache, epidural receding and patient able to bend at knees Anesthetic complications: no     Last Vitals:  Vitals:   11/26/15 0245 11/26/15 0644  BP: 114/63 (!) 104/49  Pulse: 75 70  Resp: 20 20  Temp: 36.8 C 36.6 C    Last Pain:  Vitals:   11/26/15 0644  TempSrc: Oral  PainSc: 2    Pain Goal: Patients Stated Pain Goal: 0 (11/25/15 2243)               Rica RecordsICKELTON,Reizel Calzada

## 2015-11-26 NOTE — Progress Notes (Signed)
Post Partum Day 1 Subjective: no complaints, up ad lib, voiding and tolerating PO  Objective: Blood pressure (!) 104/49, pulse 70, temperature 97.8 F (36.6 C), temperature source Oral, resp. rate 20, height 5\' 4"  (1.626 m), weight 221 lb (100.2 kg), last menstrual period 02/28/2015, SpO2 99 %, unknown if currently breastfeeding.  Physical Exam:  General: alert, cooperative, appears stated age and no distress Lochia: appropriate Uterine Fundus: firm Incision: n/a DVT Evaluation: No evidence of DVT seen on physical exam.   Recent Labs  11/24/15 0740  HGB 11.0*  HCT 32.2*    Assessment/Plan: Plan for discharge tomorrow   LOS: 2 days   Wyvonnia DuskyMarie Nestor Wieneke 11/26/2015, 7:30 AM

## 2015-11-27 MED ORDER — ENOXAPARIN SODIUM 40 MG/0.4ML ~~LOC~~ SOLN: 40.0000 mg | SUBCUTANEOUS | 1 refills | Status: DC

## 2015-11-27 MED ORDER — SENNOSIDES-DOCUSATE SODIUM 8.6-50 MG PO TABS: 2.0000 | ORAL_TABLET | Freq: Two times a day (BID) | ORAL | 1 refills | Status: DC

## 2015-11-27 MED ORDER — IBUPROFEN 600 MG PO TABS: 600.0000 mg | ORAL_TABLET | Freq: Four times a day (QID) | ORAL | 2 refills | Status: DC

## 2015-11-27 MED ORDER — OXYCODONE-ACETAMINOPHEN 5-325 MG PO TABS: 1.0000 | ORAL_TABLET | ORAL | 0 refills | Status: DC | PRN

## 2015-11-27 NOTE — Lactation Note (Signed)
This note was copied from a baby's chart. Lactation Consultation Note Mom's 3rd baby. Has 39 yr old that she didn't BF d/t her milk never came in. Has a 39 yr old that her milk came in but had decided not to BF d/t painful latching. Mom has decided to pump and bottle feed. RN set up DEBP and mom has only pumped once. Stressed the importance of pumping to induce her milk to come in. Educated supply and demand. Mom has DEBP at home. Mom stated she didn't have the tubing. Explained the tubing is hers to take home.  Mom states she doesn't have colostrum. Mom has soft pendulum breast w/flat nipples, compressible, flattens after stimulation gone. Hand expression for a few minutes w/no colostrum noted.  Encouraged STS, and pumping 5-6 times a time, and nipple stimulation by hand expression after pumping.  Educated about newborn behavior and feeding behavior and habits. WH/LC brochure given w/resources, support groups and LC services. Patient Name: Carmen Lewis JXBJY'NToday's Date: 11/27/2015 Reason for consult: Initial assessment   Maternal Data Has patient been taught Hand Expression?: Yes Does the patient have breastfeeding experience prior to this delivery?: No  Feeding    LATCH Score/Interventions       Type of Nipple: Flat Intervention(s): Double electric pump     Interventions (Mild/moderate discomfort): Hand massage;Hand expression        Lactation Tools Discussed/Used Tools: Pump Breast pump type: Double-Electric Breast Pump Pump Review: Setup, frequency, and cleaning;Milk Storage Initiated by:: RN Date initiated:: 11/26/15   Consult Status Consult Status: Complete Date: 11/27/15    Charyl DancerCARVER, Kelby Adell G 11/27/2015, 7:19 AM

## 2015-11-27 NOTE — Discharge Summary (Signed)
Obstetric Discharge Summary Reason for Admission: induction of labor and hx of DVT, on Lovenox Prenatal Procedures: NST and ultrasound Intrapartum Procedures: spontaneous vaginal delivery Postpartum Procedures: antibiotics and Triple I Complications-Operative and Postpartum: none Hemoglobin  Date Value Ref Range Status  11/24/2015 11.0 (L) 12.0 - 15.0 g/dL Final   HCT  Date Value Ref Range Status  11/24/2015 32.2 (L) 36.0 - 46.0 % Final   Hematocrit  Date Value Ref Range Status  09/11/2015 35.3 34.0 - 46.6 % Final    Physical Exam:  General: alert, cooperative and no distress Lochia: appropriate Uterine Fundus: firm Incision: healing well, port site C/D/I from BTL 11/26/15 DVT Evaluation: No evidence of DVT seen on physical exam. No cords or calf tenderness. No significant calf/ankle edema.  Discharge Diagnoses: Term Pregnancy-delivered and BTL, DVT hx on anticoagulation  Discharge Information: Date: 11/27/2015 Activity: pelvic rest Diet: routine Medications: PNV, Ibuprofen, Colace and Percocet Condition: stable Instructions: refer to practice specific booklet Discharge to: home Follow-up Information    Carmen Lewis Lewis Carmen Lewis, CNM Follow up in 4 week(s).   Specialty:  Certified Nurse Midwife Contact information: [address] [place] [address] [phone]           Newborn Data: Live born female  Birth Weight: 8 lb 0.6 oz (3645 g) APGAR: 8, 9  Home with mother.  Carmen Lewis Carmen Lewis, CNM 11/27/2015, 7:52 AM

## 2015-11-27 NOTE — Lactation Note (Addendum)
This note was copied from a baby's chart. Lactation Consultation Note  Mother has pumped one time.  Has DEBP at home. Has primarily been formula feeding. Reviewed engorgement care and monitoring voids/stools. No questions or concerns at this time.   Patient Name: Carmen Lewis [id number]'XToday's Date: 11/27/2015 Reason for consult: Follow-up assessment   Maternal Data Has patient been taught Hand Expression?: Yes Does the patient have breastfeeding experience prior to this delivery?: No  Feeding    LATCH Score/Interventions       Type of Nipple: Flat Intervention(s): Double electric pump     Interventions (Mild/moderate discomfort): Hand massage;Hand expression        Lactation Tools Discussed/Used Tools: Pump Breast pump type: Double-Electric Breast Pump Pump Review: Setup, frequency, and cleaning;Milk Storage Initiated by:: RN Date initiated:: 11/26/15   Consult Status Consult Status: Complete Date: 11/27/15    [name], [name] 11/27/2015, 8:59 AM

## 2015-11-27 NOTE — Progress Notes (Signed)
Post Partum Day #2 Subjective: no complaints, up ad lib, voiding, tolerating PO and + flatus  Objective: Blood pressure (!) 118/55, pulse (!) 55, temperature 97.7 F (36.5 C), temperature source Oral, resp. rate 18, height 5\' 4"  (1.626 m), weight 221 lb (100.2 kg), last menstrual period 02/28/2015, SpO2 99 %, unknown if currently breastfeeding.  Physical Exam:  General: alert, cooperative and no distress Lochia: appropriate Uterine Fundus: firm Incision: no significant drainage, no dehiscence, no significant erythema DVT Evaluation: No evidence of DVT seen on physical exam. No cords or calf tenderness. No significant calf/ankle edema.  No results for input(s): HGB, HCT in the last 72 hours.  Assessment/Plan: Discharge home, Breastfeeding and Contraception BTL done 11/26/15   LOS: 3 days   Carmen Lewis, CNM 11/27/2015, 7:48 AM

## 2015-11-28 ENCOUNTER — Encounter (HOSPITAL_COMMUNITY): Payer: Self-pay

## 2015-11-28 LAB — TYPE AND SCREEN
ABO/RH(D): O POS
Antibody Screen: NEGATIVE
UNIT DIVISION: 0
Unit division: 0

## 2015-12-11 ENCOUNTER — Telehealth: Payer: Self-pay

## 2015-12-11 NOTE — Telephone Encounter (Signed)
Patient called in and stated that she has a pp appt on 12-24-15 but thinks that she may have a bacterial infection. Advised pt that I would trasnfer her to scheduling to see about earlier appts.

## 2015-12-13 ENCOUNTER — Encounter: Payer: Self-pay | Admitting: Obstetrics

## 2015-12-13 ENCOUNTER — Ambulatory Visit (INDEPENDENT_AMBULATORY_CARE_PROVIDER_SITE_OTHER): Payer: Medicaid Other | Admitting: Obstetrics

## 2015-12-13 DIAGNOSIS — Z86718 Personal history of other venous thrombosis and embolism: Secondary | ICD-10-CM

## 2015-12-13 NOTE — Addendum Note (Signed)
Addended by: Francene FindersJAMES, QUINETTA C on: 12/13/2015 04:10 PM   Modules accepted: Orders

## 2015-12-13 NOTE — Progress Notes (Signed)
Subjective:     Carmen Lewis is a 40 y.o. female who presents for a postpartum visit. She is 3 weeks postpartum following a spontaneous vaginal delivery. I have fully reviewed the prenatal and intrapartum course. The delivery was at 39 gestational weeks. Outcome: spontaneous vaginal delivery. Anesthesia: epidural. Postpartum course has been normal. Baby's course has been normal. Baby is feeding by bottle - Similac Advance. Bleeding no bleeding. Bowel function is normal. Bladder function is normal. Patient is not sexually active. Contraception method is abstinence. Postpartum depression screening: negative.  Tobacco, alcohol and substance abuse history reviewed.  Adult immunizations reviewed including TDAP, rubella and varicella.  The following portions of the patient's history were reviewed and updated as appropriate: allergies, current medications, past family history, past medical history, past social history, past surgical history and problem list.  Review of Systems A comprehensive review of systems was negative.   Objective:    BP 134/90   Pulse 74   Temp 98.1 F (36.7 C) (Oral)   Wt 198 lb 14.4 oz (90.2 kg)   BMI 34.14 kg/m   General:  alert and no distress   Breasts:  inspection negative, no nipple discharge or bleeding, no masses or nodularity palpable  Lungs: clear to auscultation bilaterally  Heart:  regular rate and rhythm, S1, S2 normal, no murmur, click, rub or gallop  Abdomen: soft, non-tender; bowel sounds normal; no masses,  no organomegaly   Vulva:  normal  Vagina: normal vagina  Cervix:  no cervical motion tenderness  Corpus: normal size, contour, position, consistency, mobility, non-tender  Adnexa:  no mass, fullness, tenderness  Rectal Exam: Not performed.             Assessment:   3 weeks postpartum.  Doing well.                                                                                                                                                                H/O DVT.  On Lovenox until  6 weeks postpartum  BV                                                      Contraceptive management.  Had PPTL.   Plan:    1. Contraception: tubal ligation done. 2. Tindamax dispensed 3. Follow up in: 4 weeks or as needed.   Healthy lifestyle practices reviewed

## 2015-12-19 ENCOUNTER — Other Ambulatory Visit: Payer: Self-pay | Admitting: Obstetrics

## 2015-12-19 DIAGNOSIS — B9689 Other specified bacterial agents as the cause of diseases classified elsewhere: Secondary | ICD-10-CM

## 2015-12-19 DIAGNOSIS — N760 Acute vaginitis: Secondary | ICD-10-CM

## 2015-12-19 LAB — NUSWAB BV AND CANDIDA, NAA
ATOPOBIUM VAGINAE: HIGH {score} — AB
BVAB 2: HIGH Score — AB
CANDIDA ALBICANS, NAA: NEGATIVE
CANDIDA GLABRATA, NAA: NEGATIVE
Megasphaera 1: HIGH Score — AB

## 2015-12-19 MED ORDER — METRONIDAZOLE 500 MG PO TABS
500.0000 mg | ORAL_TABLET | Freq: Two times a day (BID) | ORAL | 2 refills | Status: DC
Start: 2015-12-19 — End: 2016-02-17

## 2015-12-24 ENCOUNTER — Ambulatory Visit: Payer: Medicaid Other | Admitting: Certified Nurse Midwife

## 2016-01-02 ENCOUNTER — Ambulatory Visit (INDEPENDENT_AMBULATORY_CARE_PROVIDER_SITE_OTHER): Payer: Medicaid Other | Admitting: Obstetrics and Gynecology

## 2016-01-02 ENCOUNTER — Encounter: Payer: Self-pay | Admitting: Obstetrics

## 2016-01-02 ENCOUNTER — Encounter: Payer: Self-pay | Admitting: Obstetrics and Gynecology

## 2016-01-02 ENCOUNTER — Ambulatory Visit: Payer: Medicaid Other | Admitting: Certified Nurse Midwife

## 2016-01-02 VITALS — BP 130/79 | HR 56 | Ht 64.0 in | Wt 194.0 lb

## 2016-01-02 DIAGNOSIS — Z86718 Personal history of other venous thrombosis and embolism: Secondary | ICD-10-CM

## 2016-01-02 NOTE — Progress Notes (Signed)
Subjective:     Carmen Lewis is a 40 y.o. female who presents for a postpartum visit. She is 5 weeks postpartum following a spontaneous vaginal delivery. I have fully reviewed the prenatal and intrapartum course. The delivery was at 39 gestational weeks. Outcome: spontaneous vaginal delivery. Anesthesia: none. Postpartum course has been unremarkable. Baby's course has been unremarkable. Baby is feeding by bottle - Similac Advance. Bleeding on cycle now. Bowel function is normal. Bladder function is normal. Patient is sexually active. Contraception method is tubal ligation. Postpartum depression screening: negative.  The following portions of the patient's history were reviewed and updated as appropriate: allergies, current medications, past family history, past medical history, past social history and past surgical history.  Review of Systems Pertinent items are noted in HPI.   Objective:    BP 130/79   Pulse (!) 56   Ht 5\' 4"  (1.626 m)   Wt 194 lb (88 kg)   LMP 12/30/2015   Breastfeeding? No   BMI 33.30 kg/m   General:  alert   Breasts:  not examined  Lungs: clear to auscultation bilaterally  Heart:  regular rate and rhythm, S1, S2 normal, no murmur, click, rub or gallop  Abdomen: soft, non-tender; bowel sounds normal; no masses,  no organomegaly   Vulva:  not evaluated  Vagina: not evaluated  Cervix:  not evaluated  Corpus: not examined  Adnexa:  not evaluated  Rectal Exam: Not performed.        Assessment:     Normal postpartum exam.  H/O DVT  Plan:    1. Contraception: tubal ligation 2. Pt to completed Lovenox this Sunday Will follow cycle for now. Pt desired Depo Provera today for her cycle Pt informed first cycle after delivery can be heavy. Will need to follow to see if Depo Provera is needed for regulation. Return to work note provided 3. Follow up in: 1 year or as needed.

## 2016-01-09 ENCOUNTER — Ambulatory Visit: Payer: Medicaid Other | Admitting: Certified Nurse Midwife

## 2016-01-31 ENCOUNTER — Ambulatory Visit: Payer: Medicaid Other | Admitting: Obstetrics

## 2016-02-17 ENCOUNTER — Encounter (HOSPITAL_COMMUNITY): Payer: Self-pay

## 2016-02-17 ENCOUNTER — Inpatient Hospital Stay (HOSPITAL_COMMUNITY)
Admission: AD | Admit: 2016-02-17 | Discharge: 2016-02-17 | Disposition: A | Discharge status: Home / Self Care | Payer: BLUE CROSS/BLUE SHIELD | Source: Ambulatory Visit | Attending: Obstetrics & Gynecology | Admitting: Obstetrics & Gynecology

## 2016-02-17 DIAGNOSIS — N939 Abnormal uterine and vaginal bleeding, unspecified: Secondary | ICD-10-CM | POA: Diagnosis not present

## 2016-02-17 DIAGNOSIS — Z3202 Encounter for pregnancy test, result negative: Secondary | ICD-10-CM | POA: Insufficient documentation

## 2016-02-17 DIAGNOSIS — R109 Unspecified abdominal pain: Secondary | ICD-10-CM | POA: Diagnosis present

## 2016-02-17 LAB — URINALYSIS, ROUTINE W REFLEX MICROSCOPIC
BILIRUBIN URINE: NEGATIVE
GLUCOSE, UA: NEGATIVE mg/dL
KETONES UR: NEGATIVE mg/dL
Nitrite: NEGATIVE
PROTEIN: NEGATIVE mg/dL
Specific Gravity, Urine: 1.016 (ref 1.005–1.030)
pH: 5 (ref 5.0–8.0)

## 2016-02-17 LAB — WET PREP, GENITAL
Clue Cells Wet Prep HPF POC: NONE SEEN
Sperm: NONE SEEN
TRICH WET PREP: NONE SEEN
Yeast Wet Prep HPF POC: NONE SEEN

## 2016-02-17 LAB — POCT PREGNANCY, URINE: Preg Test, Ur: NEGATIVE

## 2016-02-17 NOTE — Discharge Instructions (Signed)
We will call you with the results of today's lab tests.  You may also try ibuprofen 600 mg by mouth every 6 hours for 3-5 days to see if the bleeding will stop.  Take the ibuprofen with food.

## 2016-02-17 NOTE — MAU Provider Note (Signed)
History     CSN: [id number]  Arrival date and time: 02/17/16 1142   First Provider Initiated Contact with Patient 02/17/16 1254      Chief Complaint  Patient presents with  . Abdominal Pain   HPI Carmen Lewis 41 y.o. Comes to MAU with periodic brown vaginal discharge and periodic lower abdominal cramping that has been happening for about one month.  Had her last baby 3 months ago and had a tubal ligation.  Has continued to have some vaginal bleeding and had a Depo Provera injection as she did not want to have continued vaginal bleeding.  LMP 01-25-16.  Is thinking her next period is near and that is what her cramping feels like.  But has been treated several times for BV and is wondering if she has BV again.   OB History    Gravida Para Term Preterm AB Living   5 3 3   2 3    SAB TAB Ectopic Multiple Live Births   1 1   0 3      Past Medical History:  Diagnosis Date  . Contraceptive management   . DVT (deep venous thrombosis) (HCC) 07/2010   right leg  . Headache disorder   . Osteoporosis    LEFT HIP    Past Surgical History:  Procedure Laterality Date  . NO PAST SURGERIES    . TUBAL LIGATION Bilateral 11/25/2015   Procedure: POST PARTUM TUBAL LIGATION, Excision Left Paratubal Cyst;  Surgeon: Lesly Dukes, MD;  Location: [facility];  Service: Gynecology;  Laterality: Bilateral;    Family History  Problem Relation Age of Onset  . Hypertension Father   . Hypertension Maternal Aunt   . Hypertension Paternal Uncle   . Hypertension Maternal Grandmother   . Hypertension Maternal Grandfather   . Hypertension Paternal Grandmother   . Diabetes Mother     Social History  Substance Use Topics  . Smoking status: Never Smoker  . Smokeless tobacco: Never Used  . Alcohol use No    Allergies:  Allergies  Allergen Reactions  . Latex Itching    Prescriptions Prior to Admission  Medication Sig Dispense Refill Last Dose  . SUMAtriptan (IMITREX) 50 MG tablet Take 50 mg  by mouth every 2 (two) hours as needed for migraine.   PRN at PRN    Review of Systems  Constitutional: Negative for fever.  Genitourinary: Positive for pelvic pain, vaginal bleeding and vaginal discharge. Negative for urgency.  Musculoskeletal: Negative for back pain.   Physical Exam   Blood pressure 134/85, pulse 79, temperature 98.5 F (36.9 C), resp. rate 18, height 5\' 4"  (1.626 m), weight 198 lb (89.8 kg), SpO2 100 %, not currently breastfeeding.  Physical Exam  Nursing note and vitals reviewed. Constitutional: She is oriented to person, place, and time. She appears well-developed and well-nourished.  HENT:  Head: Normocephalic.  Eyes: EOM are normal.  Neck: Neck supple.  GI: Soft. There is no tenderness.  Genitourinary:  Genitourinary Comments: Speculum exam: Vagina - Small amount of brown discharge, no odor Cervix - On small streak of red blood Bimanual exam: Cervix closed Uterus mildly tender on bimanual, normal size Adnexa non tender, no masses bilaterally GC/Chlam, wet prep done Chaperone present for exam.   Musculoskeletal: Normal range of motion.  Neurological: She is alert and oriented to person, place, and time.  Skin: Skin is warm and dry.  Psychiatric: She has a normal mood and affect.    MAU Course  Procedures Results for orders placed or performed during the hospital encounter of 02/17/16 (from the past 24 hour(s))  Urinalysis, Routine w reflex microscopic     Status: Abnormal   Collection Time: 02/17/16 12:00 PM  Result Value Ref Range   Color, Urine YELLOW YELLOW   APPearance CLEAR CLEAR   Specific Gravity, Urine 1.016 1.005 - 1.030   pH 5.0 5.0 - 8.0   Glucose, UA NEGATIVE NEGATIVE mg/dL   Hgb urine dipstick MODERATE (A) NEGATIVE   Bilirubin Urine NEGATIVE NEGATIVE   Ketones, ur NEGATIVE NEGATIVE mg/dL   Protein, ur NEGATIVE NEGATIVE mg/dL   Nitrite NEGATIVE NEGATIVE   Leukocytes, UA TRACE (A) NEGATIVE   RBC / HPF 0-5 0 - 5 RBC/hpf   WBC, UA  0-5 0 - 5 WBC/hpf   Bacteria, UA RARE (A) NONE SEEN   Squamous Epithelial / LPF 0-5 (A) NONE SEEN   Mucous PRESENT   Pregnancy, urine POC     Status: None   Collection Time: 02/17/16 12:29 PM  Result Value Ref Range   Preg Test, Ur NEGATIVE NEGATIVE  Wet prep, genital     Status: Abnormal   Collection Time: 02/17/16  1:00 PM  Result Value Ref Range   Yeast Wet Prep HPF POC NONE SEEN NONE SEEN   Trich, Wet Prep NONE SEEN NONE SEEN   Clue Cells Wet Prep HPF POC NONE SEEN NONE SEEN   WBC, Wet Prep HPF POC MODERATE (A) NONE SEEN   Sperm NONE SEEN     MDM No clue cells, no thin white discharge seen, no evidence for Bacterial Vaginosis.  The vaginal bleeding is bothersome but presently not a medical risk.  Lab tests are pending for any other vaginal infection.  Would suggest for now to try OTC ibuprofen 600 mg PO Q6H to see if the bleeding will stop.  Given that she has had one dose of Depo, there may be some irregular bleeding that occurs.  If it continues, follow up at her MD - [facility].  Assessment and Plan  Abnormal vaginal bleeding.  Plan Use Ibuprofen 500 mg PO q 6H x 3-5 days to see if vaginal bleeding will stop. Other labs pending. Follow up with [facility] if you continue to have problems.  Lizzette Carbonell L Nishtha Raider 02/17/2016, 12:56 PM

## 2016-02-17 NOTE — MAU Note (Signed)
Pt presents to MAU with complaints of lower abdominal pain and has a brown vaginal discharge on and off. Pt was treated for BV with a vaginal cream that Dr Clearance CootsHarper prescribed.

## 2016-02-18 LAB — GC/CHLAMYDIA PROBE AMP (~~LOC~~) NOT AT ARMC
CHLAMYDIA, DNA PROBE: NEGATIVE
NEISSERIA GONORRHEA: NEGATIVE

## 2016-02-18 LAB — HIV ANTIBODY (ROUTINE TESTING W REFLEX): HIV SCREEN 4TH GENERATION: NONREACTIVE

## 2016-02-18 LAB — RPR: RPR Ser Ql: NONREACTIVE

## 2016-03-13 ENCOUNTER — Telehealth: Payer: Self-pay

## 2016-03-13 NOTE — Telephone Encounter (Signed)
Patient called with questions about abnormal bleeding after tubal.

## 2016-03-13 NOTE — Telephone Encounter (Signed)
Returned call, no answer, left vm 

## 2016-05-19 ENCOUNTER — Encounter (HOSPITAL_COMMUNITY): Payer: Self-pay | Admitting: Emergency Medicine

## 2016-05-19 ENCOUNTER — Emergency Department (HOSPITAL_COMMUNITY)
Admission: EM | Admit: 2016-05-19 | Discharge: 2016-05-19 | Disposition: A | Discharge status: Home / Self Care | Payer: Medicaid Other | Attending: Emergency Medicine | Admitting: Emergency Medicine

## 2016-05-19 DIAGNOSIS — Y999 Unspecified external cause status: Secondary | ICD-10-CM | POA: Insufficient documentation

## 2016-05-19 DIAGNOSIS — M546 Pain in thoracic spine: Secondary | ICD-10-CM | POA: Diagnosis not present

## 2016-05-19 DIAGNOSIS — M545 Low back pain: Secondary | ICD-10-CM | POA: Diagnosis not present

## 2016-05-19 DIAGNOSIS — Z91040 Latex allergy status: Secondary | ICD-10-CM | POA: Diagnosis not present

## 2016-05-19 DIAGNOSIS — Y92410 Unspecified street and highway as the place of occurrence of the external cause: Secondary | ICD-10-CM | POA: Insufficient documentation

## 2016-05-19 DIAGNOSIS — S3992XA Unspecified injury of lower back, initial encounter: Secondary | ICD-10-CM | POA: Diagnosis present

## 2016-05-19 DIAGNOSIS — Y939 Activity, unspecified: Secondary | ICD-10-CM | POA: Diagnosis not present

## 2016-05-19 DIAGNOSIS — Z79899 Other long term (current) drug therapy: Secondary | ICD-10-CM | POA: Insufficient documentation

## 2016-05-19 MED ORDER — METHOCARBAMOL 500 MG PO TABS: 500.0000 mg | ORAL_TABLET | Freq: Two times a day (BID) | ORAL | 0 refills | Status: DC
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
## 2016-08-21 ENCOUNTER — Other Ambulatory Visit (HOSPITAL_COMMUNITY)
Admission: RE | Admit: 2016-08-21 | Discharge: 2016-08-21 | Disposition: A | Discharge status: Home / Self Care | Payer: BLUE CROSS/BLUE SHIELD | Source: Ambulatory Visit | Attending: Family Medicine | Admitting: Family Medicine

## 2016-08-21 ENCOUNTER — Other Ambulatory Visit: Payer: Self-pay | Admitting: Family Medicine

## 2016-08-21 DIAGNOSIS — Z124 Encounter for screening for malignant neoplasm of cervix: Secondary | ICD-10-CM | POA: Diagnosis not present

## 2016-08-25 ENCOUNTER — Encounter: Payer: Medicaid Other | Admitting: Family Medicine

## 2016-08-25 LAB — CYTOLOGY - PAP
DIAGNOSIS: UNDETERMINED — AB
HPV: NOT DETECTED

## 2016-09-19 ENCOUNTER — Other Ambulatory Visit: Payer: Self-pay | Admitting: Family Medicine

## 2016-09-19 DIAGNOSIS — Z1239 Encounter for other screening for malignant neoplasm of breast: Secondary | ICD-10-CM

## 2016-09-30 ENCOUNTER — Ambulatory Visit
Admission: RE | Admit: 2016-09-30 | Discharge: 2016-09-30 | Disposition: A | Discharge status: Home / Self Care | Payer: BLUE CROSS/BLUE SHIELD | Source: Ambulatory Visit | Attending: Family Medicine | Admitting: Family Medicine

## 2016-09-30 ENCOUNTER — Encounter (INDEPENDENT_AMBULATORY_CARE_PROVIDER_SITE_OTHER): Payer: Self-pay

## 2016-09-30 ENCOUNTER — Ambulatory Visit: Payer: BLUE CROSS/BLUE SHIELD

## 2016-09-30 DIAGNOSIS — Z1239 Encounter for other screening for malignant neoplasm of breast: Secondary | ICD-10-CM

## 2016-10-07 ENCOUNTER — Ambulatory Visit: Payer: BLUE CROSS/BLUE SHIELD

## 2017-01-28 DIAGNOSIS — E669 Obesity, unspecified: Secondary | ICD-10-CM | POA: Insufficient documentation

## 2017-01-28 DIAGNOSIS — I10 Essential (primary) hypertension: Secondary | ICD-10-CM | POA: Insufficient documentation

## 2017-03-03 DIAGNOSIS — N939 Abnormal uterine and vaginal bleeding, unspecified: Secondary | ICD-10-CM | POA: Insufficient documentation

## 2017-11-04 ENCOUNTER — Other Ambulatory Visit: Payer: Self-pay | Admitting: Family Medicine

## 2017-11-04 DIAGNOSIS — Z1239 Encounter for other screening for malignant neoplasm of breast: Secondary | ICD-10-CM

## 2018-02-24 ENCOUNTER — Ambulatory Visit (HOSPITAL_COMMUNITY)
Admission: EM | Admit: 2018-02-24 | Discharge: 2018-02-24 | Disposition: A | Discharge status: Home / Self Care | Payer: BLUE CROSS/BLUE SHIELD | Attending: Family Medicine | Admitting: Family Medicine

## 2018-02-24 ENCOUNTER — Encounter (HOSPITAL_COMMUNITY): Payer: Self-pay

## 2018-02-24 ENCOUNTER — Other Ambulatory Visit: Payer: Self-pay

## 2018-02-24 DIAGNOSIS — B9689 Other specified bacterial agents as the cause of diseases classified elsewhere: Secondary | ICD-10-CM

## 2018-02-24 DIAGNOSIS — A5901 Trichomonal vulvovaginitis: Secondary | ICD-10-CM

## 2018-02-24 DIAGNOSIS — N760 Acute vaginitis: Secondary | ICD-10-CM

## 2018-02-24 MED ORDER — METRONIDAZOLE 500 MG PO TABS
2000.0000 mg | ORAL_TABLET | Freq: Once | ORAL | Status: AC
  Administered 2018-02-24: 2000 mg via ORAL

## 2018-02-24 MED ORDER — METRONIDAZOLE 500 MG PO TABS
ORAL_TABLET | ORAL | Status: AC
  Filled 2018-02-24: qty 4

## 2018-02-24 MED ORDER — ONDANSETRON 4 MG PO TBDP
ORAL_TABLET | ORAL | Status: AC
  Filled 2018-02-24: qty 1

## 2018-02-24 MED ORDER — ONDANSETRON 4 MG PO TBDP
4.0000 mg | ORAL_TABLET | Freq: Once | ORAL | Status: AC
  Administered 2018-02-24: 4 mg via ORAL

## 2018-02-24 MED ORDER — METRONIDAZOLE 0.75 % VA GEL: 1.0000 | Freq: Every day | VAGINAL | 0 refills | Status: AC

## 2018-02-24 NOTE — Discharge Instructions (Signed)
Flagyl 2 g given in office today to cover for trichomonas.  You can use MetroGel to help with BV.  Refrain from sexual activity and alcohol use for the next 7 days. Monitor for any worsening of symptoms, fever, abdominal pain, nausea, vomiting, to follow up for reevaluation.

## 2018-02-24 NOTE — ED Triage Notes (Signed)
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 So she knows her issue she just needs the meds. Pt cc she has a vaginal discharge

## 2018-02-24 NOTE — ED Provider Notes (Signed)
[facility]    CSN: [id number] Arrival date & time: 02/24/18  1800     History   Chief Complaint Chief Complaint  Patient presents with  . Vaginal Discharge    HPI Carmen Lewis is a 43 y.o. female.   43 year old female comes in for treatment for trichomonas and bacterial vaginitis.  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  She was noted to have BV and trichomonas by a provider.  States [place], and came in for treatment.  She denies abdominal pain, nausea, vomiting.  Denies fever, chills, night sweats.  Denies urinary symptoms such as frequency, dysuria, hematuria.  She has an IUD implant.  Sexually active with one female partner, no condom use.     Past Medical History:  Diagnosis Date  . Contraceptive management   . DVT (deep venous thrombosis) (HCC) 07/2010   right leg  . Headache disorder   . Osteoporosis    LEFT HIP    Patient Active Problem List   Diagnosis Date Noted  . History of DVT (deep vein thrombosis) 08/21/2015    Past Surgical History:  Procedure Laterality Date  . NO PAST SURGERIES    . TUBAL LIGATION Bilateral 11/25/2015   Procedure: POST PARTUM TUBAL LIGATION, Excision Left Paratubal Cyst;  Surgeon: Lesly Dukes, MD;  Location: [facility];  Service: Gynecology;  Laterality: Bilateral;    OB History    Gravida  5   Para  3   Term  3   Preterm      AB  2   Living  3     SAB  1   TAB  1   Ectopic      Multiple  0   Live Births  3            Home Medications    Prior to Admission medications   Medication Sig Start Date End Date Taking? Authorizing Provider  methocarbamol (ROBAXIN) 500 MG tablet Take 1 tablet (500 mg total) by mouth 2 (two) times daily. 05/19/16   Law, Waylan Boga, PA-C  metroNIDAZOLE (METROGEL VAGINAL) 0.75 % vaginal gel Place 1 Applicatorful vaginally at bedtime for 5 days. 02/24/18 03/01/18  Belinda Fisher, PA-C  SUMAtriptan (IMITREX)  50 MG tablet Take 50 mg by mouth every 2 (two) hours as needed for migraine.    [provider]    Family History Family History  Problem Relation Age of Onset  . Hypertension Father   . Hypertension Maternal Aunt   . Breast cancer Maternal Aunt 49  . Hypertension Paternal Uncle   . Hypertension Maternal Grandmother   . Hypertension Maternal Grandfather   . Hypertension Paternal Grandmother   . Diabetes Mother     Social History Social History   Tobacco Use  . Smoking status: Never Smoker  . Smokeless tobacco: Never Used  Substance Use Topics  . Alcohol use: No  . Drug use: No     Allergies   Latex   Review of Systems Review of Systems  Reason unable to perform ROS: See HPI as above.     Physical Exam Triage Vital Signs ED Triage Vitals  Enc Vitals Group     BP 02/24/18 1851 (!) 143/81     Pulse Rate 02/24/18 1851 67     Resp 02/24/18 1851 18     Temp 02/24/18 1851 97.8 F (36.6 C)  Temp Source 02/24/18 1851 Tympanic     SpO2 02/24/18 1851 100 %     Weight 02/24/18 1852 197 lb (89.4 kg)     Height --      Head Circumference --      Peak Flow --      Pain Score 02/24/18 1923 8     Pain Loc --      Pain Edu? --      Excl. in GC? --    No data found.  Updated Vital Signs BP (!) 143/81 (BP Location: Right Arm)   Pulse 67   Temp 97.8 F (36.6 C) (Tympanic)   Resp 18   Wt 197 lb (89.4 kg)   SpO2 100%   BMI 33.81 kg/m   Physical Exam Constitutional:      General: She is not in acute distress.    Appearance: She is well-developed.  HENT:     Head: Normocephalic and atraumatic.  Eyes:     Conjunctiva/sclera: Conjunctivae normal.     Pupils: Pupils are equal, round, and reactive to light.  Cardiovascular:     Rate and Rhythm: Normal rate and regular rhythm.     Heart sounds: Normal heart sounds. No murmur. No friction rub. No gallop.   Pulmonary:     Effort: Pulmonary effort is normal.     Breath sounds: Normal breath sounds. No  wheezing or rales.  Abdominal:     General: Bowel sounds are normal.     Palpations: Abdomen is soft. There is no mass.     Tenderness: There is no abdominal tenderness. There is no guarding or rebound.  Skin:    General: Skin is warm and dry.  Neurological:     Mental Status: She is alert and oriented to person, place, and time.  Psychiatric:        Behavior: Behavior normal.        Judgment: Judgment normal.      UC Treatments / Results  Labs (all labs ordered are listed, but only abnormal results are displayed) Labs Reviewed - No data to display  EKG None  Radiology No results found.  Procedures Procedures (including critical care time)  Medications Ordered in UC Medications  metroNIDAZOLE (FLAGYL) tablet 2,000 mg (2,000 mg Oral Given 02/24/18 1923)  ondansetron (ZOFRAN-ODT) disintegrating tablet 4 mg (4 mg Oral Given 02/24/18 1923)    Initial Impression / Assessment and Plan / UC Course  I have reviewed the triage vital signs and the nursing notes.  Pertinent labs & imaging results that were available during my care of the patient were reviewed by me and considered in my medical decision making (see chart for details).    Patient with history of nausea/vomiting during course of Flagyl.  Will provide Zofran and give 2 g of Flagyl here for trichomonas.  Patient can use MetroGel for BV.  Refrain from sexual intercourse and alcohol use for the next 7 days.  Return precautions given.  Patient expresses understanding and agrees to plan.  Final Clinical Impressions(s) / UC Diagnoses   Final diagnoses:  BV (bacterial vaginosis)  Trichomonas vaginitis    ED Prescriptions    Medication Sig Dispense Auth. Provider   metroNIDAZOLE (METROGEL VAGINAL) 0.75 % vaginal gel Place 1 Applicatorful vaginally at bedtime for 5 days. 50 g Threasa Alpha, [place] 02/24/18 [phone]

## 2018-03-08 ENCOUNTER — Ambulatory Visit
Admission: RE | Admit: 2018-03-08 | Discharge: 2018-03-08 | Disposition: A | Discharge status: Home / Self Care | Payer: Self-pay | Source: Ambulatory Visit | Attending: Family Medicine | Admitting: Family Medicine

## 2018-03-08 DIAGNOSIS — Z1239 Encounter for other screening for malignant neoplasm of breast: Secondary | ICD-10-CM

## 2018-04-03 IMAGING — US US MFM OB FOLLOW-UP
1 series · 14 of 28 positions shown · non-contrast
Comparison: none

[Series 1: us mfm ob follow-up · 31 acquisitions, 14 frames shown]
[im 2/31]
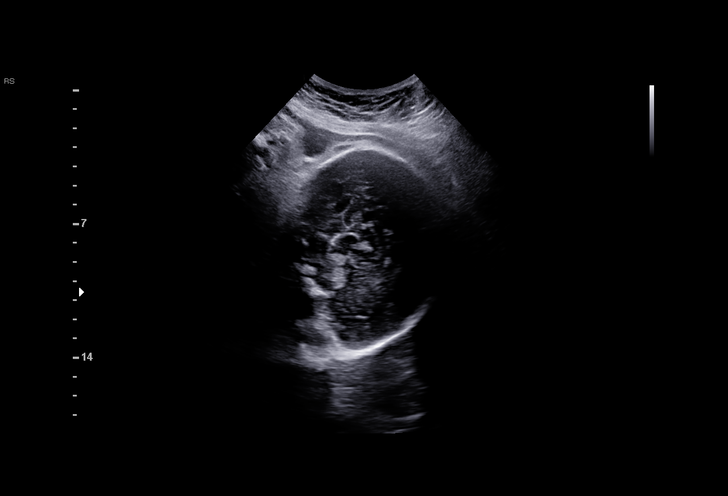
[im 4/31]
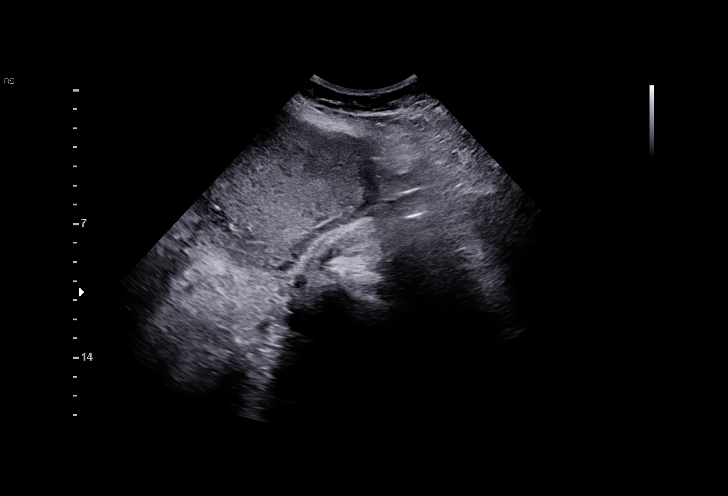
[im 6/31]
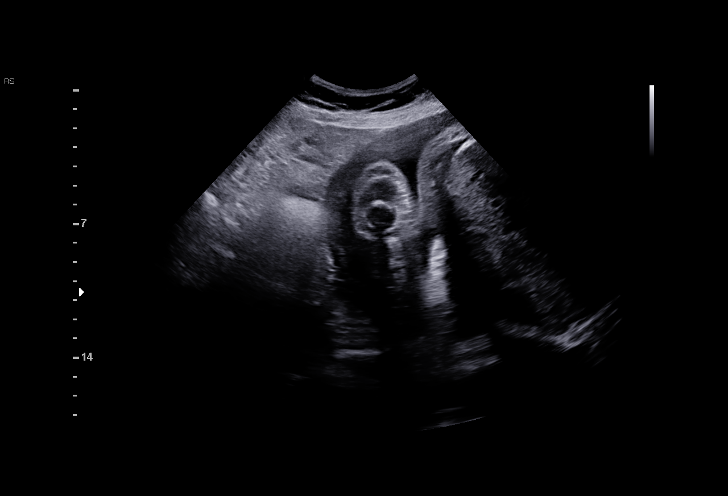
[im 8/31]
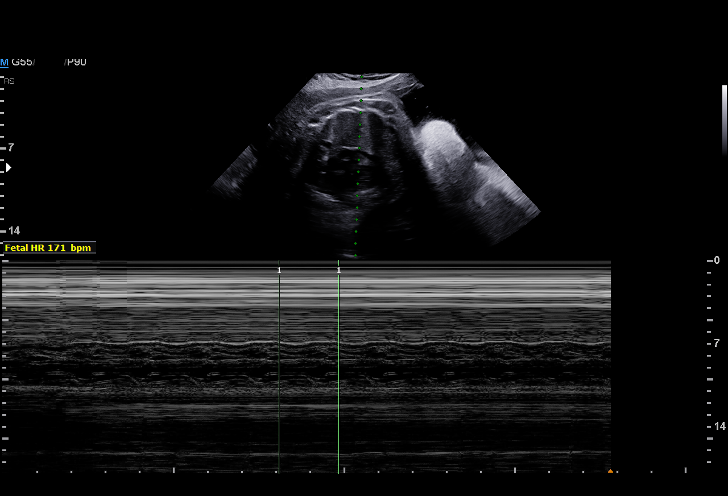
[im 11/31]
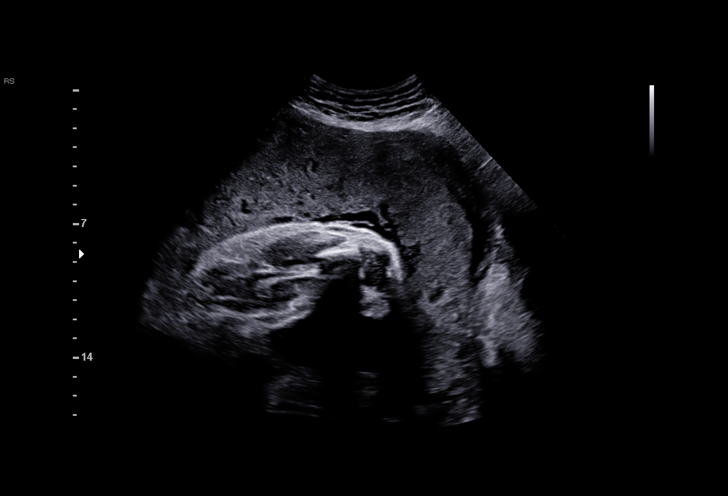
[im 13/31]
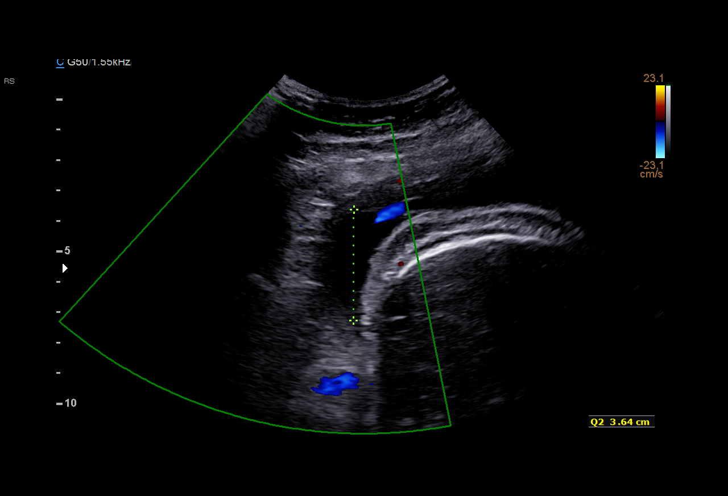
[im 15/31]
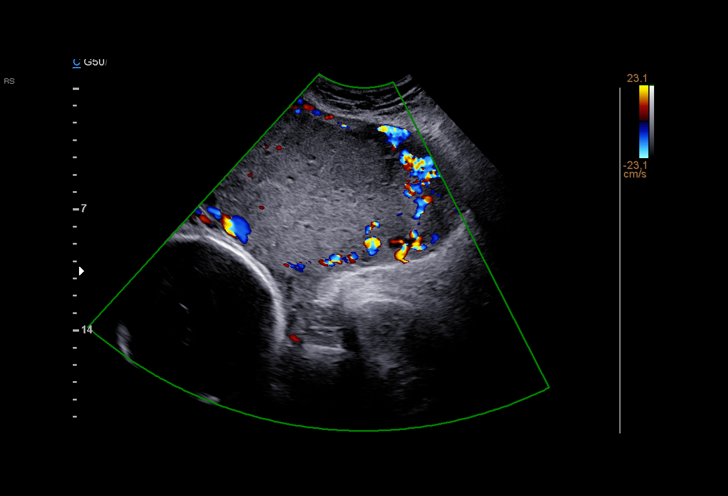
[im 17/31]
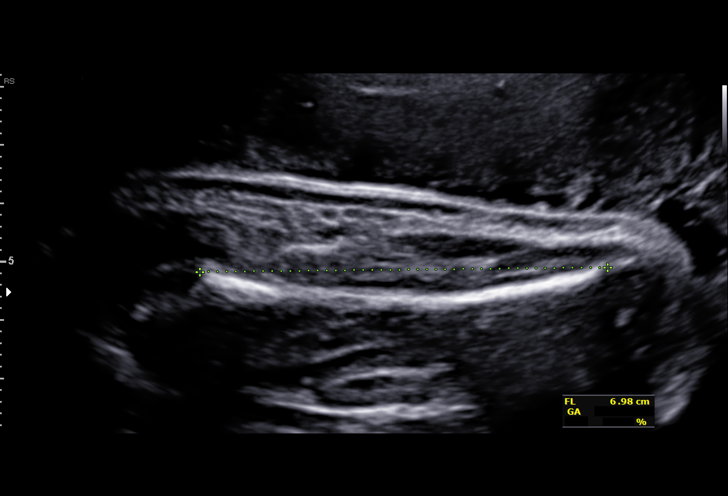
[im 19/31]
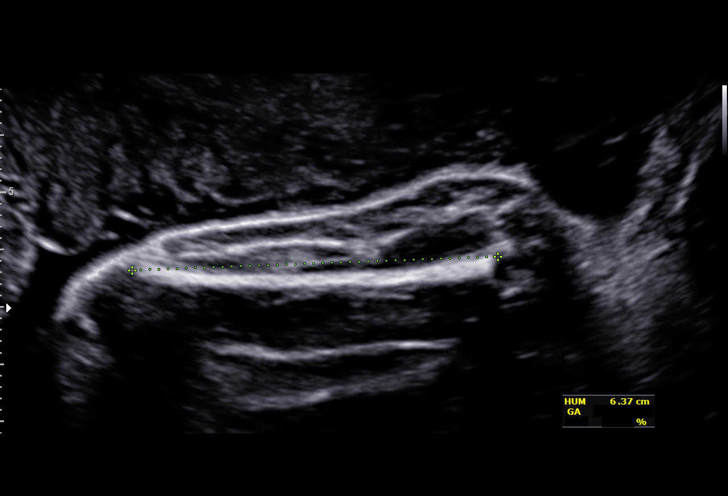
[im 22/31]
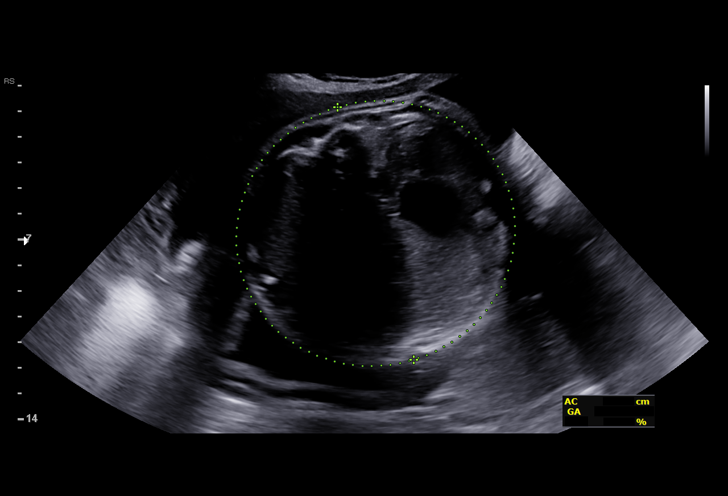
[im 24/31]
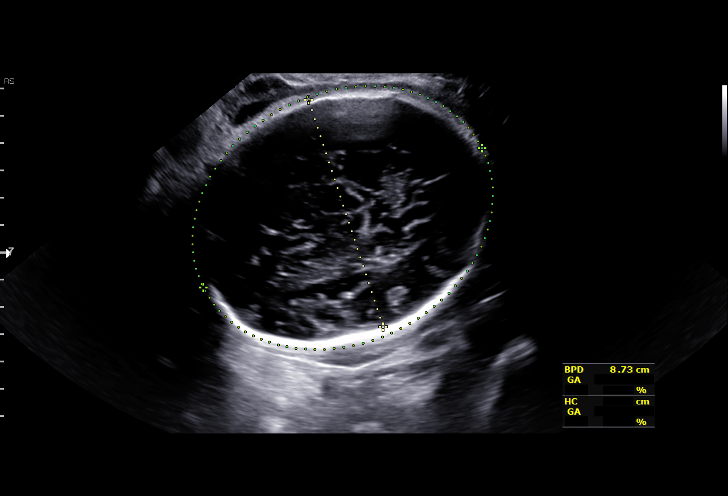
[im 26/31]
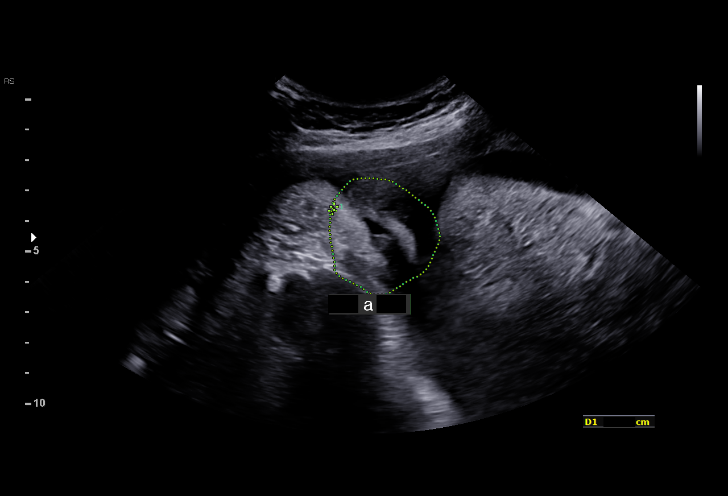
[im 28/31]
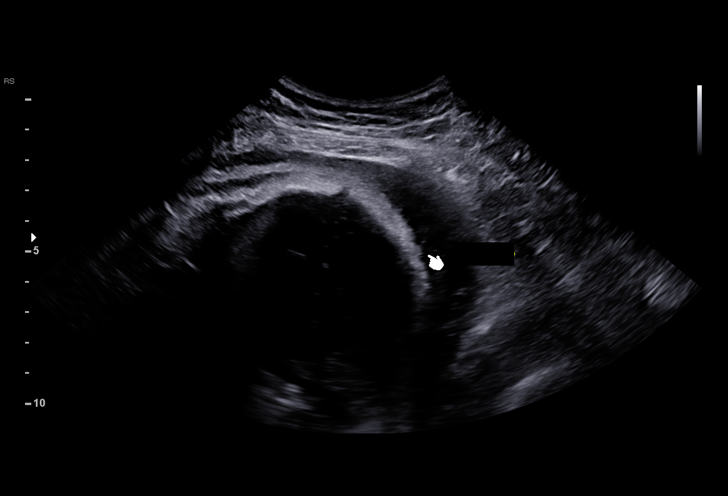
[im 31/31]
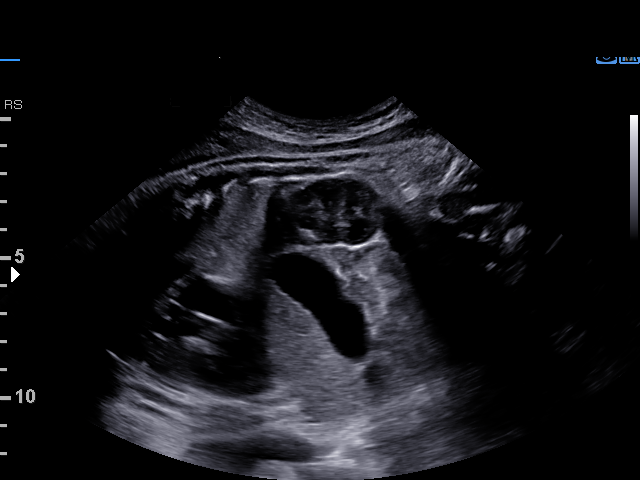

[14 of 28 positions shown; findings below may reference images not displayed]

Road [HOSPITAL]

1  MIDORI MIRO           [id number]      [id number]     [id number]
Indications

36 weeks gestation of pregnancy
Deep vein thrombosis (DVT) (8708) on
lovenox
Advanced maternal age multigravida 35+,
third trimester; declined testing
OB History

Blood Type:            Height:  5'6"   Weight (lb):  205      BMI:
Gravidity:    5         Term:   2        Prem:   0        SAB:   1
TOP:          1       Ectopic:  0        Living: 2
Fetal Evaluation

Num Of Fetuses:     1
Fetal Heart         171
Rate(bpm):
Cardiac Activity:   Observed
Presentation:       Cephalic
Placenta:           Anterior, above cervical os

Amniotic Fluid
AFI FV:      Subjectively within normal limits

AFI Sum(cm)     %Tile       Largest Pocket(cm)
15.86           60          9

RUQ(cm)                     LUQ(cm)        LLQ(cm)
3.22                        3.64           9
Biometry
BPD:      88.8  mm     G. Age:  35w 6d         37  %    CI:        74.56   %   70 - 86
FL/HC:      21.3   %   20.8 -
HC:      326.4  mm     G. Age:  37w 0d         27  %    HC/AC:      0.98       0.92 -
AC:      334.5  mm     G. Age:  37w 2d         76  %    FL/BPD:     78.4   %   71 - 87
FL:       69.6  mm     G. Age:  35w 5d         21  %    FL/AC:      20.8   %   20 - 24
HUM:      63.4  mm     G. Age:  36w 6d         67  %

Est. FW:    0300  gm    6 lb 11 oz      66  %
Gestational Age

LMP:           36w 2d       Date:   02/28/15                 EDD:   12/05/15
U/S Today:     36w 3d                                        EDD:   12/04/15
Best:          36w 6d    Det. By:   Early Ultrasound         EDD:   12/01/15
(04/13/15)
Anatomy

Cranium:               Previously seen        Aortic Arch:            Previously seen
Cavum:                 Previously seen        Ductal Arch:            Previously seen
Ventricles:            Previously seen        Diaphragm:              Previously seen
Choroid Plexus:        Previously seen        Stomach:                Appears normal, left
sided
Cerebellum:            Previously seen        Abdomen:                Previously seen
Posterior Fossa:       Previously seen        Abdominal Wall:         Previously seen
Nuchal Fold:           Previously seen        Cord Vessels:           Previously seen
Face:                  Orbits and profile     Kidneys:                Appear normal
previously seen
Lips:                  Previously seen        Bladder:                Appears normal
Thoracic:              Appears normal         Spine:                  Previously seen
Previously seen
Heart:                 EIF in LV prev;        Upper Extremities:      Previously seen
normal 4CH prev.
RVOT:                  Previously seen        Lower Extremities:      Previously seen
LVOT:                  Previously seen

Other:  Female gender seen.  Heels and 5th digit previously seen.
Technically difficult due to advanced GA and fetal position.
Cervix Uterus Adnexa

Cervix
Not visualized (advanced GA >54wks)

Uterus
No abnormality visualized.

Left Ovary
Not visualized.

Right Ovary
Not visualized.

Adnexa:       No abnormality visualized. No adnexal mass
visualized.
Impression

Single living intrauterine pregnancy at 36 weeks 6 days.
Appropriate interval fetal growth (66%).
Normal amniotic fluid volume.
Normal interval fetal anatomy.
Recommendations

Follow-up ultrasounds as clinically indicated.

## 2018-06-16 DIAGNOSIS — S92919A Unspecified fracture of unspecified toe(s), initial encounter for closed fracture: Secondary | ICD-10-CM | POA: Insufficient documentation

## 2018-06-25 DIAGNOSIS — K219 Gastro-esophageal reflux disease without esophagitis: Secondary | ICD-10-CM | POA: Insufficient documentation

## 2018-06-25 DIAGNOSIS — G43809 Other migraine, not intractable, without status migrainosus: Secondary | ICD-10-CM | POA: Insufficient documentation

## 2018-07-05 ENCOUNTER — Other Ambulatory Visit (HOSPITAL_BASED_OUTPATIENT_CLINIC_OR_DEPARTMENT_OTHER): Payer: Self-pay

## 2018-07-05 DIAGNOSIS — G4710 Hypersomnia, unspecified: Secondary | ICD-10-CM

## 2018-07-05 DIAGNOSIS — R0683 Snoring: Secondary | ICD-10-CM

## 2018-07-05 DIAGNOSIS — G4730 Sleep apnea, unspecified: Secondary | ICD-10-CM

## 2018-07-21 ENCOUNTER — Other Ambulatory Visit: Payer: Self-pay

## 2018-07-21 ENCOUNTER — Ambulatory Visit (HOSPITAL_BASED_OUTPATIENT_CLINIC_OR_DEPARTMENT_OTHER): Payer: Medicaid Other | Attending: Internal Medicine | Admitting: Internal Medicine

## 2018-07-21 DIAGNOSIS — G4710 Hypersomnia, unspecified: Secondary | ICD-10-CM | POA: Diagnosis not present

## 2018-07-21 DIAGNOSIS — R0683 Snoring: Secondary | ICD-10-CM | POA: Diagnosis present

## 2018-07-21 DIAGNOSIS — R0902 Hypoxemia: Secondary | ICD-10-CM | POA: Insufficient documentation

## 2018-07-21 DIAGNOSIS — G4730 Sleep apnea, unspecified: Secondary | ICD-10-CM | POA: Diagnosis not present

## 2018-07-21 DIAGNOSIS — R0681 Apnea, not elsewhere classified: Secondary | ICD-10-CM | POA: Insufficient documentation

## 2018-07-24 NOTE — Procedures (Signed)
   NAME: Carmen Lewis DATE OF BIRTH:  1975-08-11 MEDICAL RECORD NUMBER [id number]  LOCATION: [facility]  PHYSICIAN: Marius Ditch  DATE OF STUDY: 07/21/2018  SLEEP STUDY TYPE: Nocturnal Polysomnogram               REFERRING PHYSICIAN: Marius Ditch, MD  INDICATION FOR STUDY: loud snoring, witnessed apnea, awakening gasping for breath, excessive daytime sleepiness  EPWORTH SLEEPINESS SCORE:  NA HEIGHT: 5\' 4"  (162.6 cm)  WEIGHT: 198 lb (89.8 kg)    Body mass index is 33.99 kg/m.  NECK SIZE: 15 in.  MEDICATIONS  Patient self administered medications include: N/A. Medications administered during study include No sleep medicine administered.Marland Kitchen   SLEEP STUDY TECHNIQUE  A multi-channel overnight Polysomnography study was performed. The channels recorded and monitored were central and occipital EEG, electrooculogram (EOG), submentalis EMG (chin), nasal and oral airflow, thoracic and abdominal wall motion, anterior tibialis EMG, snore microphone, electrocardiogram, and a pulse oximetry.   TECHNICAL COMMENTS  Comments added by Technician: Patient had difficulty initiating sleep. Comments added by Scorer: N/A   SLEEP ARCHITECTURE  The study was initiated at 9:59:23 PM and terminated at 4:58:32 AM. The total recorded time was 419.1 minutes. EEG confirmed total sleep time was 327 minutes yielding a sleep efficiency of 78.0%%. Sleep onset after lights out was 60.5 minutes with a REM latency of 126.5 minutes. The patient spent 9.3%% of the night in stage N1 sleep, 65.1%% in stage N2 sleep, 0.0%% in stage N3 and 25.5% in REM. Wake after sleep onset (WASO) was 31.7 minutes. The Arousal Index was 12.1/hour.   RESPIRATORY PARAMETERS  There were a total of 4 respiratory disturbances out of which 1 were apneas ( 0 obstructive, 0 mixed, 1 central) and 3 hypopneas. The apnea/hypopnea index (AHI) was 0.7 events/hour. The central sleep apnea index was 0.2 events/hour. The REM AHI  was 2.2 events/hour and NREM AHI was 0.2 events/hour. The supine AHI was 1.1 events/hour and the non supine AHI was 0.4 events/hour. Respiratory disturbances were associated with oxygen desaturation down to a nadir of 84.0% during sleep. The mean oxygen saturation during the study was 90.7%. The cumulative time under 88% oxygen saturation was 5.5 minutes.   LEG MOVEMENT DATA  The total leg movements were 0 with a resulting leg movement index of 0.0/hr . Associated arousal with leg movement index was 0.0/hr.   CARDIAC DATA  The underlying cardiac rhythm was most consistent with sinus rhythm. Mean heart rate during sleep was 76.0 bpm. Additional rhythm abnormalities include None.   IMPRESSIONS  No Significant Obstructive Sleep apnea(OSA). Non-significant sleep-disordered breathing is seen.  No Significant Central Sleep Apnea (CSA) No Significant Upper Airway Resistance Syndrome(UARS).  No significant periodic leg movements(PLMs) during sleep.   DIAGNOSIS  Nocturnal Hypoxemia (327.26 [G47.36 ICD-10])  RECOMMENDATIONS  There is no indication for CPAP or any other intervention for sleep disordered breathing.    [place], American Board of Sleep Medicine  ELECTRONICALLY SIGNED ON:  07/24/2018, 6:01 PM [place] PH: [phone]   FX: [phone] [place]

## 2018-07-30 ENCOUNTER — Emergency Department (HOSPITAL_COMMUNITY)
Admission: EM | Admit: 2018-07-30 | Discharge: 2018-07-31 | Disposition: A | Discharge status: Home / Self Care | Payer: Medicaid Other | Attending: Emergency Medicine | Admitting: Emergency Medicine

## 2018-07-30 ENCOUNTER — Other Ambulatory Visit: Payer: Self-pay

## 2018-07-30 ENCOUNTER — Encounter (HOSPITAL_COMMUNITY): Payer: Self-pay | Admitting: Emergency Medicine

## 2018-07-30 DIAGNOSIS — Z5321 Procedure and treatment not carried out due to patient leaving prior to being seen by health care provider: Secondary | ICD-10-CM | POA: Insufficient documentation

## 2018-07-30 DIAGNOSIS — R51 Headache: Secondary | ICD-10-CM | POA: Diagnosis not present

## 2018-07-30 NOTE — ED Triage Notes (Signed)
Patient complaining of migraine that started today. Patient states she has no other symptoms with it.

## 2018-08-05 NOTE — Progress Notes (Signed)
Upon further review of the patient's raw data, it is apparent that the patient's time below an O2 sat of 88% is incorrect in the body of the interpretation. There it indicates that it was 5.5 minutes. However, the correct amount of time of 58.2 minutes. This is a significant of nocturnal hypoxemia. Further evaluation is warranted and will be arranged through the [facility] or through the patient's primary care physician.

## 2018-09-24 ENCOUNTER — Other Ambulatory Visit (HOSPITAL_BASED_OUTPATIENT_CLINIC_OR_DEPARTMENT_OTHER): Payer: Self-pay

## 2018-09-24 DIAGNOSIS — G4710 Hypersomnia, unspecified: Secondary | ICD-10-CM

## 2018-09-24 DIAGNOSIS — R0683 Snoring: Secondary | ICD-10-CM

## 2018-09-24 DIAGNOSIS — G4730 Sleep apnea, unspecified: Secondary | ICD-10-CM

## 2018-09-30 ENCOUNTER — Encounter (HOSPITAL_BASED_OUTPATIENT_CLINIC_OR_DEPARTMENT_OTHER): Payer: Medicaid Other | Admitting: Internal Medicine

## 2018-10-13 ENCOUNTER — Ambulatory Visit (HOSPITAL_BASED_OUTPATIENT_CLINIC_OR_DEPARTMENT_OTHER): Payer: Medicaid Other | Attending: Internal Medicine | Admitting: Internal Medicine

## 2018-10-13 ENCOUNTER — Other Ambulatory Visit: Payer: Self-pay

## 2018-10-13 DIAGNOSIS — G4730 Sleep apnea, unspecified: Secondary | ICD-10-CM | POA: Diagnosis present

## 2018-10-13 DIAGNOSIS — G4710 Hypersomnia, unspecified: Secondary | ICD-10-CM

## 2018-10-13 DIAGNOSIS — R0683 Snoring: Secondary | ICD-10-CM

## 2018-10-15 ENCOUNTER — Encounter (HOSPITAL_BASED_OUTPATIENT_CLINIC_OR_DEPARTMENT_OTHER): Payer: Medicaid Other | Admitting: Internal Medicine

## 2018-10-18 NOTE — Procedures (Signed)
   NAME: Carmen Lewis DATE OF BIRTH:  1975-04-04 MEDICAL RECORD NUMBER [id number]  LOCATION: [facility]  PHYSICIAN: Marius Ditch  DATE OF STUDY: 10/13/2018  SLEEP STUDY TYPE: Nocturnal Polysomnogram               REFERRING PHYSICIAN: Marius Ditch, MD  INDICATION FOR STUDY: witnessed apnea, loud snoring, excessive daytime sleepiness; previously negative PSG - test done to exclude false negatives.   EPWORTH SLEEPINESS SCORE:  NA HEIGHT: 5\' 4"  (162.6 cm)  WEIGHT: 207 lb (93.9 kg)    Body mass index is 35.53 kg/m.  NECK SIZE: 16 in.  MEDICATIONS  Patient self administered medications include: MELATONIN. Medications administered during study include No sleep medicine administered.Marland Kitchen   SLEEP STUDY TECHNIQUE  A multi-channel overnight Polysomnography study was performed. The channels recorded and monitored were central and occipital EEG, electrooculogram (EOG), submentalis EMG (chin), nasal and oral airflow, thoracic and abdominal wall motion, anterior tibialis EMG, snore microphone, electrocardiogram, and a pulse oximetry.   TECHNICAL COMMENTS  Comments added by Technician: NO RESTROOM VISTED Comments added by Scorer: N/A   SLEEP ARCHITECTURE  The study was initiated at 10:14:04 PM and terminated at 4:36:27 AM. The total recorded time was 382.4 minutes. EEG confirmed total sleep time was 324.1 minutes yielding a sleep efficiency of 84.8%%. Sleep onset after lights out was 50.8 minutes with a REM latency of 107.0 minutes. The patient spent 1.9%% of the night in stage N1 sleep, 69.9%% in stage N2 sleep, 0.0%% in stage N3 and 28.2% in REM. Wake after sleep onset (WASO) was 7.5 minutes. The Arousal Index was 8.5/hour.   RESPIRATORY PARAMETERS  There were a total of 9 respiratory disturbances out of which 2 were apneas ( 2 obstructive, 0 mixed, 0 central) and 7 hypopneas. The apnea/hypopnea index (AHI) was 1.7 events/hour. The central sleep apnea index was 0.0  events/hour. The REM AHI was 4.6 events/hour and NREM AHI was 0.5 events/hour. The supine AHI was 2.2 events/hour and the non supine AHI was 0/hr. Respiratory disturbances were associated with oxygen desaturation down to a nadir of 83.0% during sleep. The mean oxygen saturation during the study was 92.0%. The cumulative time under 88% oxygen saturation was 7.8 minutes.  LEG MOVEMENT DATA  The total leg movements were 0 with a resulting leg movement index of 0.0/hr . Associated arousal with leg movement index was 0.0/hr.   CARDIAC DATA  The underlying cardiac rhythm was most consistent with sinus rhythm. Mean heart rate during sleep was 75.6 bpm. Additional rhythm abnormalities include None.   IMPRESSIONS  Sleep disordered breathing is present but does not reach the level to be diagnostic of obstructive sleep apnea. Observers could see witnessed apnea.  Mild Oxygen Desaturation No significant periodic leg movements(PLMs) during sleep.   DIAGNOSIS  Witnessed apnea.   RECOMMENDATIONS  There is not indication for treatment of sleep disordered breathing.    Marius Ditch Sleep specialist, [place] Board of Internal Medicine  ELECTRONICALLY SIGNED ON:  10/18/2018, 8:22 PM [place] PH: [phone]   FX: [phone] [place]

## 2019-01-11 ENCOUNTER — Ambulatory Visit: Payer: Medicaid Other | Attending: Internal Medicine

## 2019-01-11 DIAGNOSIS — Z20822 Contact with and (suspected) exposure to covid-19: Secondary | ICD-10-CM

## 2019-01-13 ENCOUNTER — Telehealth: Payer: Self-pay

## 2019-01-13 LAB — NOVEL CORONAVIRUS, NAA: SARS-CoV-2, NAA: NOT DETECTED

## 2019-01-13 NOTE — Telephone Encounter (Signed)
Pt notified of negative COVID-19 results. Understanding verbalized.  Chasta M Hopkins   

## 2019-01-18 ENCOUNTER — Ambulatory Visit: Payer: Medicaid Other | Attending: Internal Medicine

## 2019-01-18 DIAGNOSIS — Z20822 Contact with and (suspected) exposure to covid-19: Secondary | ICD-10-CM

## 2019-01-19 LAB — NOVEL CORONAVIRUS, NAA: SARS-CoV-2, NAA: NOT DETECTED

## 2019-01-28 ENCOUNTER — Ambulatory Visit: Payer: Medicaid Other | Attending: Internal Medicine

## 2019-01-28 DIAGNOSIS — Z20822 Contact with and (suspected) exposure to covid-19: Secondary | ICD-10-CM

## 2019-01-29 LAB — NOVEL CORONAVIRUS, NAA: SARS-CoV-2, NAA: NOT DETECTED

## 2019-02-08 ENCOUNTER — Ambulatory Visit: Payer: Medicaid Other | Attending: Internal Medicine

## 2019-02-08 DIAGNOSIS — Z20822 Contact with and (suspected) exposure to covid-19: Secondary | ICD-10-CM

## 2019-02-09 LAB — NOVEL CORONAVIRUS, NAA: SARS-CoV-2, NAA: NOT DETECTED

## 2019-02-27 ENCOUNTER — Emergency Department (HOSPITAL_COMMUNITY)
Admission: EM | Admit: 2019-02-27 | Discharge: 2019-02-27 | Disposition: A | Discharge status: Home / Self Care | Payer: No Typology Code available for payment source | Attending: Emergency Medicine | Admitting: Emergency Medicine

## 2019-02-27 ENCOUNTER — Other Ambulatory Visit: Payer: Self-pay

## 2019-02-27 ENCOUNTER — Encounter (HOSPITAL_COMMUNITY): Payer: Self-pay | Admitting: Obstetrics and Gynecology

## 2019-02-27 DIAGNOSIS — Z79899 Other long term (current) drug therapy: Secondary | ICD-10-CM | POA: Diagnosis not present

## 2019-02-27 DIAGNOSIS — R519 Headache, unspecified: Secondary | ICD-10-CM | POA: Diagnosis not present

## 2019-02-27 DIAGNOSIS — R030 Elevated blood-pressure reading, without diagnosis of hypertension: Secondary | ICD-10-CM | POA: Insufficient documentation

## 2019-02-27 DIAGNOSIS — Z91040 Latex allergy status: Secondary | ICD-10-CM | POA: Insufficient documentation

## 2019-02-27 MED ORDER — SODIUM CHLORIDE 0.9 % IV BOLUS
500.0000 mL | Freq: Once | INTRAVENOUS | Status: AC
  Administered 2019-02-27: 500 mL via INTRAVENOUS

## 2019-02-27 MED ORDER — KETOROLAC TROMETHAMINE 15 MG/ML IJ SOLN
15.0000 mg | Freq: Once | INTRAMUSCULAR | Status: AC
  Administered 2019-02-27: 15 mg via INTRAVENOUS
  Filled 2019-02-27: qty 1

## 2019-02-27 MED ORDER — METOCLOPRAMIDE HCL 5 MG/ML IJ SOLN
10.0000 mg | Freq: Once | INTRAMUSCULAR | Status: AC
  Administered 2019-02-27: 10 mg via INTRAVENOUS
  Filled 2019-02-27: qty 2

## 2019-02-27 MED ORDER — DEXAMETHASONE SODIUM PHOSPHATE 10 MG/ML IJ SOLN
10.0000 mg | Freq: Once | INTRAMUSCULAR | Status: AC
  Administered 2019-02-27: 10 mg via INTRAVENOUS
  Filled 2019-02-27: qty 1

## 2019-02-27 NOTE — Discharge Instructions (Signed)
Take Tylenol or ibuprofen as needed for headache. Take your triptan as prescribed for headaches. Make sure you are staying well-hydrated with water. Follow-up with your primary care doctor for further evaluation of your headaches and blood pressure.  If you do not have a primary care doctor, follow-up with [place] and wellness, information is listed below. Return to the emergency room if you develop fevers, vision changes, numbness/tingling, severe worsening headache, any new, worsening, or concerning symptoms.

## 2019-02-27 NOTE — ED Provider Notes (Signed)
[facility]-EMERGENCY DEPT Provider Note   CSN: [id number] Arrival date & time: 02/27/19  1827     History Chief Complaint  Patient presents with  . Headache  . Hypertension    Carmen Lewis is a 44 y.o. female presenting for evaluation of headache.  Patient states around 3:00 today she was in a meeting when she developed frontal and temporal headache.  She describes as a dull pain.  She has associated photophobia.  She had nausea earlier, but this is since resolved.  She took Tylenol at 5 PM without improvement of symptoms.  She is not taken anything else including her Imitrex.  She has a history of headaches, is often hypertensive when she has headaches.  She denies fall, trauma, or injury.  She denies fevers, chills, cough, chest pain, shortness breath, abdominal pain, urinary symptoms, abnormal bowel movements.  She denies numbness or tingling. She reports a history of a DVT 7 years ago, is no longer on blood thinners.   HPI     Past Medical History:  Diagnosis Date  . Contraceptive management   . DVT (deep venous thrombosis) (HCC) 07/2010   right leg  . Headache disorder   . Osteoporosis    LEFT HIP    Patient Active Problem List   Diagnosis Date Noted  . Snoring 07/21/2018  . Witnessed episode of apnea 07/21/2018  . Excessive sleepiness 07/21/2018  . History of DVT (deep vein thrombosis) 08/21/2015    Past Surgical History:  Procedure Laterality Date  . NO PAST SURGERIES    . TUBAL LIGATION Bilateral 11/25/2015   Procedure: POST PARTUM TUBAL LIGATION, Excision Left Paratubal Cyst;  Surgeon: Lesly Dukes, MD;  Location: [facility];  Service: Gynecology;  Laterality: Bilateral;     OB History    Gravida  5   Para  3   Term  3   Preterm      AB  2   Living  3     SAB  1   TAB  1   Ectopic      Multiple  0   Live Births  3           Family History  Problem Relation Age of Onset  . Hypertension Father   .  Hypertension Maternal Aunt   . Breast cancer Maternal Aunt 49  . Hypertension Paternal Uncle   . Hypertension Maternal Grandmother   . Hypertension Maternal Grandfather   . Hypertension Paternal Grandmother   . Diabetes Mother     Social History   Tobacco Use  . Smoking status: Never Smoker  . Smokeless tobacco: Never Used  Substance Use Topics  . Alcohol use: No  . Drug use: No    Home Medications Prior to Admission medications   Medication Sig Start Date End Date Taking? Authorizing Provider  FLUoxetine (PROZAC) 20 MG capsule Take 20 mg by mouth daily. 02/25/19  Yes [provider]  SUMAtriptan (IMITREX) 50 MG tablet Take 50 mg by mouth every 2 (two) hours as needed for migraine.   Yes [provider]  methocarbamol (ROBAXIN) 500 MG tablet Take 1 tablet (500 mg total) by mouth 2 (two) times daily. Patient not taking: Reported on 02/27/2019 05/19/16   Emi Holes, PA-C    Allergies    Other and Latex  Review of Systems   Review of Systems  Eyes: Positive for photophobia.  Neurological: Positive for headaches.  All other systems reviewed and are  negative.   Physical Exam Updated Vital Signs BP 132/78   Pulse 74   Temp 98.3 F (36.8 C) (Oral)   Resp 17   Ht 5\' 4"  (1.626 m)   Wt 93.9 kg   SpO2 97%   BMI 35.53 kg/m   Physical Exam Vitals and nursing note reviewed.  Constitutional:      General: She is not in acute distress.    Appearance: She is well-developed.     Comments: Appears nontoxic.  Patient talking on her phone in no acute distress  HENT:     Head: Normocephalic and atraumatic.  Eyes:     Extraocular Movements: Extraocular movements intact.     Conjunctiva/sclera: Conjunctivae normal.     Pupils: Pupils are equal, round, and reactive to light.     Comments: EOMI and PERRLA.  No nystagmus.  Neck:     Comments: Moving head in all directions without pain.  No signs of meningismus Cardiovascular:     Rate and Rhythm: Normal  rate and regular rhythm.     Pulses: Normal pulses.  Pulmonary:     Effort: Pulmonary effort is normal. No respiratory distress.     Breath sounds: Normal breath sounds. No wheezing.     Comments: Clear lung sounds in all fields Abdominal:     General: There is no distension.     Palpations: Abdomen is soft. There is no mass.     Tenderness: There is no abdominal tenderness. There is no guarding or rebound.  Musculoskeletal:        General: Normal range of motion.     Cervical back: Normal range of motion and neck supple.     Comments: Strength and sensation intact x4.  Radial and pedal pulses 2+ bilaterally.  Skin:    General: Skin is warm and dry.     Capillary Refill: Capillary refill takes less than 2 seconds.  Neurological:     General: No focal deficit present.     Mental Status: She is alert and oriented to person, place, and time.     GCS: GCS eye subscore is 4. GCS verbal subscore is 5. GCS motor subscore is 6.     Cranial Nerves: Cranial nerves are intact.     Sensory: Sensation is intact.     Motor: Motor function is intact.     Coordination: Coordination is intact.     Comments: No obvious neurologic deficits.     ED Results / Procedures / Treatments   Labs (all labs ordered are listed, but only abnormal results are displayed) Labs Reviewed - No data to display  EKG None  Radiology No results found.  Procedures Procedures (including critical care time)  Medications Ordered in ED Medications  metoCLOPramide (REGLAN) injection 10 mg (10 mg Intravenous Given 02/27/19 1937)  ketorolac (TORADOL) 15 MG/ML injection 15 mg (15 mg Intravenous Given 02/27/19 1937)  dexamethasone (DECADRON) injection 10 mg (10 mg Intravenous Given 02/27/19 1937)  sodium chloride 0.9 % bolus 500 mL (0 mLs Intravenous Stopped 02/27/19 2100)    ED Course  I have reviewed the triage vital signs and the nursing notes.  Pertinent labs & imaging results that were available during my care  of the patient were reviewed by me and considered in my medical decision making (see chart for details).    MDM Rules/Calculators/A&P                      Presenting for evaluation  of headache.  Physical examination, she appears nontoxic.  No neurologic deficits.  Patient is mildly hypertensive, but not significantly.  Favor pain causing hypertension as opposed to hypertensive emergency.  Doubt SAH, ICH, CVA.  Doubt infection.  Will treat with headache cocktail and reassess.  On reassessment, patient states headache is improved.  Her nausea is completely resolved.  Blood pressure is improved with pain control, as such favor high blood pressure in the setting of pain as opposed to elevated blood pressure causing headache.  As headache has improved, I do not believe she needs imaging such as CT or MRI with a normal neuro exam.  Discussed findings and plan with patient.  At this time, patient appears safe for discharge.  Return precautions given.  Patient states she understands and agrees to plan.  Final Clinical Impression(s) / ED Diagnoses Final diagnoses:  Acute nonintractable headache, unspecified headache type    Rx / DC Orders ED Discharge Orders    None       Franchot Heidelberg, PA-C 02/27/19 2233    Maudie Flakes, MD 02/28/19 1558

## 2019-02-27 NOTE — ED Triage Notes (Signed)
Pt reports elevated BP and headaches. Patient denies head trauma but endorses nausea.

## 2019-03-17 DIAGNOSIS — M25511 Pain in right shoulder: Secondary | ICD-10-CM | POA: Insufficient documentation

## 2019-05-06 ENCOUNTER — Other Ambulatory Visit: Payer: Self-pay | Admitting: Family Medicine

## 2019-05-06 DIAGNOSIS — Z1231 Encounter for screening mammogram for malignant neoplasm of breast: Secondary | ICD-10-CM

## 2019-05-10 ENCOUNTER — Other Ambulatory Visit: Payer: Self-pay

## 2019-05-10 ENCOUNTER — Ambulatory Visit
Admission: RE | Admit: 2019-05-10 | Discharge: 2019-05-10 | Disposition: A | Discharge status: Home / Self Care | Payer: No Typology Code available for payment source | Source: Ambulatory Visit | Attending: Family Medicine | Admitting: Family Medicine

## 2019-05-10 DIAGNOSIS — Z1231 Encounter for screening mammogram for malignant neoplasm of breast: Secondary | ICD-10-CM

## 2019-05-11 ENCOUNTER — Telehealth: Payer: Medicaid Other | Admitting: Nurse Practitioner

## 2019-05-11 DIAGNOSIS — H5789 Other specified disorders of eye and adnexa: Secondary | ICD-10-CM

## 2019-05-11 MED ORDER — POLYMYXIN B-TRIMETHOPRIM 10000-0.1 UNIT/ML-% OP SOLN: 2.0000 [drp] | OPHTHALMIC | 0 refills | Status: DC

## 2019-05-11 NOTE — Progress Notes (Signed)
E-Visit for Pink Eye   We are sorry that you are not feeling well.  Here is how we plan to help!  Based on what you have shared with me it looks like you have conjunctivitis.  Conjunctivitis is a common inflammatory or infectious condition of the eye that is often referred to as "pink eye".  In most cases it is contagious (viral or bacterial). However, not all conjunctivitis requires antibiotics (ex. Allergic).  We have made appropriate suggestions for you based upon your presentation.  I have prescribed Polytrim Ophthalmic drops 1-2 drops 4 times a day times 5 days  Pink eye can be highly contagious.  It is typically spread through direct contact with secretions, or contaminated objects or surfaces that one may have touched.  Strict handwashing is suggested with soap and water is urged.  If not available, use alcohol based had sanitizer.  Avoid unnecessary touching of the eye.  If you wear contact lenses, you will need to refrain from wearing them until you see no white discharge from the eye for at least 24 hours after being on medication.  You should see symptom improvement in 1-2 days after starting the medication regimen.  Call us if symptoms are not improved in 1-2 days.  Home Care:  Wash your hands often!  Do not wear your contacts until you complete your treatment plan.  Avoid sharing towels, bed linen, personal items with a person who has pink eye.  See attention for anyone in your home with similar symptoms.  Get Help Right Away If:  Your symptoms do not improve.  You develop blurred or loss of vision.  Your symptoms worsen (increased discharge, pain or redness)  Your e-visit answers were reviewed by a board certified advanced clinical practitioner to complete your personal care plan.  Depending on the condition, your plan could have included both over the counter or prescription medications.  If there is a problem please reply  once you have received a response from your  provider.  Your safety is important to us.  If you have drug allergies check your prescription carefully.    You can use MyChart to ask questions about today's visit, request a non-urgent call back, or ask for a work or school excuse for 24 hours related to this e-Visit. If it has been greater than 24 hours you will need to follow up with your provider, or enter a new e-Visit to address those concerns.   You will get an e-mail in the next two days asking about your experience.  I hope that your e-visit has been valuable and will speed your recovery. Thank you for using e-visits.   5-10 minutes spent reviewing and documenting in chart.    

## 2020-02-21 ENCOUNTER — Ambulatory Visit
Admission: RE | Admit: 2020-02-21 | Discharge: 2020-02-21 | Disposition: A | Discharge status: Home / Self Care | Payer: Medicaid Other | Source: Ambulatory Visit | Attending: Obstetrics and Gynecology | Admitting: Obstetrics and Gynecology

## 2020-02-21 ENCOUNTER — Other Ambulatory Visit: Payer: Self-pay | Admitting: Obstetrics and Gynecology

## 2020-02-21 ENCOUNTER — Other Ambulatory Visit: Payer: Self-pay

## 2020-02-21 DIAGNOSIS — R928 Other abnormal and inconclusive findings on diagnostic imaging of breast: Secondary | ICD-10-CM

## 2020-02-22 ENCOUNTER — Other Ambulatory Visit: Payer: Self-pay | Admitting: General Practice

## 2020-02-22 ENCOUNTER — Other Ambulatory Visit: Payer: Self-pay | Admitting: Obstetrics and Gynecology

## 2020-02-22 ENCOUNTER — Other Ambulatory Visit (HOSPITAL_COMMUNITY)
Admission: RE | Admit: 2020-02-22 | Discharge: 2020-02-22 | Disposition: A | Discharge status: Home / Self Care | Payer: Medicaid Other | Source: Ambulatory Visit | Attending: General Practice | Admitting: General Practice

## 2020-02-22 ENCOUNTER — Ambulatory Visit
Admission: RE | Admit: 2020-02-22 | Discharge: 2020-02-22 | Disposition: A | Discharge status: Home / Self Care | Payer: Medicaid Other | Source: Ambulatory Visit | Attending: Obstetrics and Gynecology | Admitting: Obstetrics and Gynecology

## 2020-02-22 DIAGNOSIS — R928 Other abnormal and inconclusive findings on diagnostic imaging of breast: Secondary | ICD-10-CM

## 2020-02-23 ENCOUNTER — Other Ambulatory Visit: Payer: Medicaid Other

## 2020-02-24 LAB — SURGICAL PATHOLOGY

## 2020-04-04 ENCOUNTER — Telehealth: Payer: Self-pay | Admitting: Hematology and Oncology

## 2020-04-04 NOTE — Telephone Encounter (Signed)
Received a new pt referral from [facility] for axilary adenopathy.. Ms Hafen returned my call and has been scheduled to see Dr. Leonides Schanz on 4/7 at 1pm. Pt aware to arrive 20 minutes early.

## 2020-04-12 ENCOUNTER — Encounter: Payer: Self-pay | Admitting: Hematology and Oncology

## 2020-04-12 ENCOUNTER — Inpatient Hospital Stay: Payer: Medicaid Other | Attending: Hematology and Oncology | Admitting: Hematology and Oncology

## 2020-04-12 ENCOUNTER — Inpatient Hospital Stay: Payer: Medicaid Other

## 2020-04-12 ENCOUNTER — Other Ambulatory Visit: Payer: Self-pay

## 2020-04-12 VITALS — BP 136/99 | HR 69 | Temp 97.7°F | Resp 17 | Ht 64.0 in | Wt 195.0 lb

## 2020-04-12 DIAGNOSIS — R590 Localized enlarged lymph nodes: Secondary | ICD-10-CM

## 2020-04-12 DIAGNOSIS — Z86718 Personal history of other venous thrombosis and embolism: Secondary | ICD-10-CM | POA: Insufficient documentation

## 2020-04-12 DIAGNOSIS — Z803 Family history of malignant neoplasm of breast: Secondary | ICD-10-CM

## 2020-04-12 DIAGNOSIS — R599 Enlarged lymph nodes, unspecified: Secondary | ICD-10-CM

## 2020-04-12 LAB — C-REACTIVE PROTEIN: CRP: 0.7 mg/dL (ref <1.0)

## 2020-04-12 LAB — CMP (CANCER CENTER ONLY)
ALT: 12 U/L (ref 0–44)
AST: 13 U/L — ABNORMAL LOW (ref 15–41)
Albumin: 3.7 g/dL (ref 3.5–5.0)
Alkaline Phosphatase: 72 U/L (ref 38–126)
Anion gap: 9 (ref 5–15)
BUN: 13 mg/dL (ref 6–20)
CO2: 24 mmol/L (ref 22–32)
Calcium: 8.8 mg/dL — ABNORMAL LOW (ref 8.9–10.3)
Chloride: 106 mmol/L (ref 98–111)
Creatinine: 0.81 mg/dL (ref 0.44–1.00)
GFR, Estimated: >60 mL/min (ref >60)
Glucose, Bld: 84 mg/dL (ref 70–99)
Potassium: 4 mmol/L (ref 3.5–5.1)
Sodium: 139 mmol/L (ref 135–145)
Total Bilirubin: 0.3 mg/dL (ref 0.3–1.2)
Total Protein: 7 g/dL (ref 6.5–8.1)

## 2020-04-12 LAB — CBC WITH DIFFERENTIAL (CANCER CENTER ONLY)
Abs Immature Granulocytes: 0.01 10*3/uL (ref 0.00–0.07)
Basophils Absolute: 0 10*3/uL (ref 0.0–0.1)
Basophils Relative: 0 %
Eosinophils Absolute: 0.1 10*3/uL (ref 0.0–0.5)
Eosinophils Relative: 2 %
HCT: 40.3 % (ref 36.0–46.0)
Hemoglobin: 13.6 g/dL (ref 12.0–15.0)
Immature Granulocytes: 0 %
Lymphocytes Relative: 27 %
Lymphs Abs: 1.9 10*3/uL (ref 0.7–4.0)
MCH: 30 pg (ref 26.0–34.0)
MCHC: 33.7 g/dL (ref 30.0–36.0)
MCV: 89 fL (ref 80.0–100.0)
Monocytes Absolute: 0.6 10*3/uL (ref 0.1–1.0)
Monocytes Relative: 8 %
Neutro Abs: 4.3 10*3/uL (ref 1.7–7.7)
Neutrophils Relative %: 63 %
Platelet Count: 235 10*3/uL (ref 150–400)
RBC: 4.53 MIL/uL (ref 3.87–5.11)
RDW: 12.6 % (ref 11.5–15.5)
WBC Count: 6.9 10*3/uL (ref 4.0–10.5)
nRBC: 0 % (ref 0.0–0.2)

## 2020-04-12 LAB — SEDIMENTATION RATE: Sed Rate: 20 mm/hr (ref 0–22)

## 2020-04-12 LAB — LACTATE DEHYDROGENASE: LDH: 147 U/L (ref 98–192)
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
## 2020-04-20 ENCOUNTER — Other Ambulatory Visit (HOSPITAL_BASED_OUTPATIENT_CLINIC_OR_DEPARTMENT_OTHER): Payer: Self-pay

## 2020-04-20 ENCOUNTER — Emergency Department (HOSPITAL_BASED_OUTPATIENT_CLINIC_OR_DEPARTMENT_OTHER)
Admission: EM | Admit: 2020-04-20 | Discharge: 2020-04-20 | Disposition: A | Discharge status: Home / Self Care | Payer: Medicaid Other | Attending: Emergency Medicine | Admitting: Emergency Medicine

## 2020-04-20 ENCOUNTER — Other Ambulatory Visit: Payer: Self-pay

## 2020-04-20 ENCOUNTER — Encounter (HOSPITAL_BASED_OUTPATIENT_CLINIC_OR_DEPARTMENT_OTHER): Payer: Self-pay | Admitting: *Deleted

## 2020-04-20 DIAGNOSIS — N898 Other specified noninflammatory disorders of vagina: Secondary | ICD-10-CM

## 2020-04-20 DIAGNOSIS — Z91040 Latex allergy status: Secondary | ICD-10-CM | POA: Insufficient documentation

## 2020-04-20 DIAGNOSIS — Z202 Contact with and (suspected) exposure to infections with a predominantly sexual mode of transmission: Secondary | ICD-10-CM | POA: Diagnosis not present

## 2020-04-20 DIAGNOSIS — R350 Frequency of micturition: Secondary | ICD-10-CM | POA: Insufficient documentation

## 2020-04-20 LAB — URINALYSIS, ROUTINE W REFLEX MICROSCOPIC
Bilirubin Urine: NEGATIVE
Glucose, UA: NEGATIVE mg/dL
Ketones, ur: NEGATIVE mg/dL
Leukocytes,Ua: NEGATIVE
Nitrite: NEGATIVE
Protein, ur: NEGATIVE mg/dL
Specific Gravity, Urine: 1.02 (ref 1.005–1.030)
pH: 5.5 (ref 5.0–8.0)

## 2020-04-20 LAB — WET PREP, GENITAL
Clue Cells Wet Prep HPF POC: NONE SEEN
Sperm: NONE SEEN
Trich, Wet Prep: NONE SEEN
Yeast Wet Prep HPF POC: NONE SEEN

## 2020-04-20 LAB — PREGNANCY, URINE: Preg Test, Ur: NEGATIVE

## 2020-04-20 LAB — HIV ANTIBODY (ROUTINE TESTING W REFLEX): HIV Screen 4th Generation wRfx: NONREACTIVE

## 2020-04-20 MED ORDER — DOXYCYCLINE HYCLATE 100 MG PO TABS
100.0000 mg | ORAL_TABLET | Freq: Once | ORAL | Status: AC
  Administered 2020-04-20: 100 mg via ORAL
  Filled 2020-04-20: qty 1

## 2020-04-20 MED ORDER — CEFTRIAXONE SODIUM 500 MG IJ SOLR
500.0000 mg | Freq: Once | INTRAMUSCULAR | Status: AC
  Administered 2020-04-20: 500 mg via INTRAMUSCULAR
  Filled 2020-04-20: qty 500

## 2020-04-20 MED ORDER — DOXYCYCLINE HYCLATE 100 MG PO CAPS
100.0000 mg | ORAL_CAPSULE | Freq: Two times a day (BID) | ORAL | 0 refills | Status: AC
  Filled 2020-04-20: qty 14, 7d supply, fill #0
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
## 2020-04-20 NOTE — Discharge Instructions (Addendum)
Your history and exam today are consistent with possible sexual transmitted infection given the exposure and the discharge you are experiencing.  We did the pelvic exam and swabs were sent.  The blood test were also sent after we had that discussion, please follow-up on these results on MyChart and they will likely call you if they are positive.  Please rest and stay hydrated and follow-up with your OB/GYN.  If any symptoms change or worsen, please return to the nearest Emergency Department.
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
## 2020-04-20 NOTE — ED Triage Notes (Signed)
STD check

## 2020-04-21 LAB — RPR: RPR Ser Ql: NONREACTIVE

## 2020-04-23 LAB — GC/CHLAMYDIA PROBE AMP (~~LOC~~) NOT AT ARMC
Chlamydia: NEGATIVE
Comment: NEGATIVE
Comment: NORMAL
Neisseria Gonorrhea: NEGATIVE

## 2020-04-23 LAB — URINE CULTURE: Culture: NO GROWTH

## 2020-04-30 ENCOUNTER — Ambulatory Visit (HOSPITAL_COMMUNITY): Payer: Medicaid Other

## 2020-05-10 ENCOUNTER — Ambulatory Visit (HOSPITAL_COMMUNITY): Admission: RE | Admit: 2020-05-10 | Payer: Medicaid Other | Source: Ambulatory Visit

## 2020-05-17 ENCOUNTER — Ambulatory Visit (HOSPITAL_COMMUNITY)
Admission: RE | Admit: 2020-05-17 | Discharge: 2020-05-17 | Disposition: A | Discharge status: Home / Self Care | Payer: Medicaid Other | Source: Ambulatory Visit | Attending: Hematology and Oncology | Admitting: Hematology and Oncology

## 2020-05-17 ENCOUNTER — Other Ambulatory Visit: Payer: Self-pay

## 2020-05-17 DIAGNOSIS — R599 Enlarged lymph nodes, unspecified: Secondary | ICD-10-CM | POA: Diagnosis not present

## 2020-05-18 ENCOUNTER — Ambulatory Visit (HOSPITAL_COMMUNITY): Payer: Medicaid Other

## 2020-05-22 ENCOUNTER — Telehealth: Payer: Self-pay | Admitting: *Deleted

## 2020-05-22 NOTE — Telephone Encounter (Signed)
TCT patient regarding recent US of upper extremity results. No answer but was able to leave vm message for her to call back @ [phone]. Per Dr. Leonides Schanz, the lymph node under her arm is stabl;e. Advice is to come back if the lymph node changes/enlarges. No need for routine f/u in this clinic.

## 2020-05-22 NOTE — Telephone Encounter (Signed)
-----   Message from Jaci Standard, MD sent at 05/22/2020  9:27 AM EDT ----- Please let Carmen Lewis know that the lymph node under her arm is stable in size (since 2012). It is most likely benign in nature. Recommend she call our office if the lymph node were to enlarge or change or if she develops large lymph nodes elsewhere. There is no need for routine f/u in our clinic.   ----- Message ----- From: Interface, Rad Results In Sent: 05/18/2020   1:15 PM EDT To: Jaci Standard, MD

## 2020-05-22 NOTE — Telephone Encounter (Signed)
Received call back from patient. Advised that the lymph under her arm is stable in size, and likely benign in nature. Advised to call back if the node changes or gets bigger. Otherwise she does not need to follow up in this clinic. Pt voiced understanding.

## 2020-08-21 ENCOUNTER — Other Ambulatory Visit: Payer: Self-pay | Admitting: Family Medicine

## 2020-08-21 DIAGNOSIS — R590 Localized enlarged lymph nodes: Secondary | ICD-10-CM

## 2020-08-23 ENCOUNTER — Ambulatory Visit
Admission: RE | Admit: 2020-08-23 | Discharge: 2020-08-23 | Disposition: A | Discharge status: Home / Self Care | Payer: Medicaid Other | Source: Ambulatory Visit | Attending: Family Medicine | Admitting: Family Medicine

## 2020-08-23 DIAGNOSIS — R590 Localized enlarged lymph nodes: Secondary | ICD-10-CM

## 2020-10-11 DIAGNOSIS — M25512 Pain in left shoulder: Secondary | ICD-10-CM | POA: Diagnosis not present

## 2021-01-23 ENCOUNTER — Ambulatory Visit
Admission: EM | Admit: 2021-01-23 | Discharge: 2021-01-23 | Disposition: A | Discharge status: Home / Self Care | Payer: Medicaid Other | Attending: Internal Medicine | Admitting: Internal Medicine

## 2021-01-23 ENCOUNTER — Other Ambulatory Visit: Payer: Self-pay

## 2021-01-23 DIAGNOSIS — Z20822 Contact with and (suspected) exposure to covid-19: Secondary | ICD-10-CM | POA: Diagnosis not present

## 2021-01-23 DIAGNOSIS — R6889 Other general symptoms and signs: Secondary | ICD-10-CM

## 2021-01-23 DIAGNOSIS — J069 Acute upper respiratory infection, unspecified: Secondary | ICD-10-CM | POA: Diagnosis not present

## 2021-01-23 DIAGNOSIS — R509 Fever, unspecified: Secondary | ICD-10-CM

## 2021-01-23 LAB — POCT INFLUENZA A/B
Influenza A, POC: NEGATIVE
Influenza B, POC: NEGATIVE

## 2021-01-23 MED ORDER — OSELTAMIVIR PHOSPHATE 75 MG PO CAPS: 75.0000 mg | ORAL_CAPSULE | Freq: Two times a day (BID) | ORAL | 0 refills | Status: DC

## 2021-01-23 MED ORDER — BENZONATATE 100 MG PO CAPS: 100.0000 mg | ORAL_CAPSULE | Freq: Three times a day (TID) | ORAL | 0 refills | Status: DC | PRN

## 2021-01-23 NOTE — Discharge Instructions (Signed)
Your rapid flu was negative but suspicious of the flu given your symptoms and physical exam.  You are being treated with Tamiflu.  COVID-19 test is pending.

## 2021-01-23 NOTE — ED Provider Notes (Signed)
Carmen Lewis    CSN: [id number] Arrival date & time: 01/23/21  1818      History   Chief Complaint Chief Complaint  Patient presents with   Cough    HPI Carmen Lewis is a 46 y.o. female.   Patient presents with cough, nasal congestion, sore throat, body aches, chills, fever that started yesterday.  Patient not sure of T-max at home but states that she "felt feverish".  Patient has taken Advil cold and flu with minimal improvement in symptoms.  Denies chest pain, shortness of breath, nausea, vomiting, diarrhea, abdominal pain.  Denies any known sick contacts.   Cough  Past Medical History:  Diagnosis Date   Contraceptive management    DVT (deep venous thrombosis) ([facility]) 07/2010   right leg   Headache disorder    Osteoporosis    LEFT HIP    Patient Active Problem List   Diagnosis Date Noted   Snoring 07/21/2018   Witnessed episode of apnea 07/21/2018   Excessive sleepiness 07/21/2018   History of DVT (deep vein thrombosis) 08/21/2015    Past Surgical History:  Procedure Laterality Date   NO PAST SURGERIES     TUBAL LIGATION Bilateral 11/25/2015   Procedure: POST PARTUM TUBAL LIGATION, Excision Left Paratubal Cyst;  Surgeon: Guss Bunde, MD;  Location: [facility];  Service: Gynecology;  Laterality: Bilateral;    OB History     Gravida  5   Para  3   Term  3   Preterm      AB  2   Living  3      SAB  1   IAB  1   Ectopic      Multiple  0   Live Births  3            Home Medications    Prior to Admission medications   Medication Sig Start Date End Date Taking? Authorizing Provider  benzonatate (TESSALON) 100 MG capsule Take 1 capsule (100 mg total) by mouth every 8 (eight) hours as needed for cough. 01/23/21  Yes Purva Vessell, Michele Rockers, FNP  oseltamivir (TAMIFLU) 75 MG capsule Take 1 capsule (75 mg total) by mouth every 12 (twelve) hours. 01/23/21  Yes Pearlina Friedly, Michele Rockers, FNP    Family History Family History  Problem Relation Age of  Onset   Hypertension Father    Hypertension Maternal Aunt    Breast cancer Maternal Aunt 49   Hypertension Paternal Uncle    Hypertension Maternal Grandmother    Hypertension Maternal Grandfather    Hypertension Paternal Grandmother    Diabetes Mother     Social History Social History   Tobacco Use   Smoking status: Never   Smokeless tobacco: Never  Vaping Use   Vaping Use: Never used  Substance Use Topics   Alcohol use: No   Drug use: No     Allergies   Other and Latex   Review of Systems Review of Systems Per HPI  Physical Exam Triage Vital Signs ED Triage Vitals [01/23/21 1836]  Enc Vitals Group     BP 128/76     Pulse Rate 90     Resp 18     Temp 100.2 F (37.9 C)     Temp Source Oral     SpO2 97 %     Weight      Height      Head Circumference      Peak Flow  Pain Score 0     Pain Loc      Pain Edu?      Excl. in [place]?    No data found.  Updated Vital Signs BP 128/76 (BP Location: Left Arm)    Pulse 90    Temp 100.2 F (37.9 C) (Oral)    Resp 18    SpO2 97%   Visual Acuity Right Eye Distance:   Left Eye Distance:   Bilateral Distance:    Right Eye Near:   Left Eye Near:    Bilateral Near:     Physical Exam Constitutional:      General: She is not in acute distress.    Appearance: Normal appearance. She is not toxic-appearing or diaphoretic.  HENT:     Head: Normocephalic and atraumatic.     Right Ear: Tympanic membrane and ear canal normal.     Left Ear: Tympanic membrane and ear canal normal.     Nose: Congestion present.     Mouth/Throat:     Mouth: Mucous membranes are moist.     Pharynx: No posterior oropharyngeal erythema.  Eyes:     Extraocular Movements: Extraocular movements intact.     Conjunctiva/sclera: Conjunctivae normal.     Pupils: Pupils are equal, round, and reactive to light.  Cardiovascular:     Rate and Rhythm: Normal rate and regular rhythm.     Pulses: Normal pulses.     Heart sounds: Normal heart  sounds.  Pulmonary:     Effort: Pulmonary effort is normal. No respiratory distress.     Breath sounds: Normal breath sounds. No stridor. No wheezing, rhonchi or rales.  Abdominal:     General: Abdomen is flat. Bowel sounds are normal.     Palpations: Abdomen is soft.  Musculoskeletal:        General: Normal range of motion.     Cervical back: Normal range of motion.  Skin:    General: Skin is warm and dry.  Neurological:     General: No focal deficit present.     Mental Status: She is alert and oriented to person, place, and time. Mental status is at baseline.  Psychiatric:        Mood and Affect: Mood normal.        Behavior: Behavior normal.     UC Treatments / Results  Labs (all labs ordered are listed, but only abnormal results are displayed) Labs Reviewed  NOVEL CORONAVIRUS, NAA  POCT INFLUENZA A/B    EKG   Radiology No results found.  Procedures Procedures (including critical Lewis time)  Medications Ordered in UC Medications - No data to display  Initial Impression / Assessment and Plan / UC Course  I have reviewed the triage vital signs and the nursing notes.  Pertinent labs & imaging results that were available during my Lewis of the patient were reviewed by me and considered in my medical decision making (see chart for details).     Patient presents with symptoms likely from a viral upper respiratory infection. Differential includes bacterial pneumonia, sinusitis, allergic rhinitis, COVID 19, flu. Do not suspect underlying cardiopulmonary process. Symptoms seem unlikely related to ACS, CHF or COPD exacerbations, pneumonia, pneumothorax. Patient is nontoxic appearing and not in need of emergent medical intervention.  Rapid flu was negative.  COVID-19 test is pending.   Recommended symptom control with over the counter medications.  Highly suspicious of the flu so will opt to treat with Tamiflu.  Fever monitoring and management  discussed with  patient.  Return if symptoms fail to improve in 1-2 weeks or you develop shortness of breath, chest pain, severe headache. Patient states understanding and is agreeable.  Discharged with PCP followup.  Final Clinical Impressions(s) / UC Diagnoses   Final diagnoses:  Viral upper respiratory tract infection with cough  Flu-like symptoms  Encounter for laboratory testing for COVID-19 virus  Fever, unspecified     Discharge Instructions      Your rapid flu was negative but suspicious of the flu given your symptoms and physical exam.  You are being treated with Tamiflu.  COVID-19 test is pending.     ED Prescriptions     Medication Sig Dispense Auth. Provider   oseltamivir (TAMIFLU) 75 MG capsule Take 1 capsule (75 mg total) by mouth every 12 (twelve) hours. 10 capsule [place], [place] E, [place]   benzonatate (TESSALON) 100 MG capsule Take 1 capsule (100 mg total) by mouth every 8 (eight) hours as needed for cough. 21 capsule [place], Michele Rockers, [place]      PDMP not reviewed this encounter.   Teodora Medici, [place] 01/23/21 [phone]

## 2021-01-23 NOTE — ED Triage Notes (Signed)
Pt c/o cough, sore throat, headache, body aches, chills,   Denies nasal congestion, ear ache, nausea, vomiting, diarrhea, constipation   Onset ~ yesterday

## 2021-01-24 LAB — SARS-COV-2, NAA 2 DAY TAT

## 2021-01-24 LAB — NOVEL CORONAVIRUS, NAA: SARS-CoV-2, NAA: NOT DETECTED

## 2021-04-17 DIAGNOSIS — N760 Acute vaginitis: Secondary | ICD-10-CM | POA: Diagnosis not present

## 2021-04-17 DIAGNOSIS — N898 Other specified noninflammatory disorders of vagina: Secondary | ICD-10-CM | POA: Diagnosis not present

## 2021-04-17 DIAGNOSIS — Z113 Encounter for screening for infections with a predominantly sexual mode of transmission: Secondary | ICD-10-CM | POA: Diagnosis not present

## 2021-05-12 ENCOUNTER — Ambulatory Visit
Admission: EM | Admit: 2021-05-12 | Discharge: 2021-05-12 | Disposition: A | Discharge status: Home / Self Care | Payer: Medicaid Other | Attending: Internal Medicine | Admitting: Internal Medicine

## 2021-05-12 DIAGNOSIS — R21 Rash and other nonspecific skin eruption: Secondary | ICD-10-CM | POA: Diagnosis not present

## 2021-05-12 MED ORDER — TRIAMCINOLONE ACETONIDE 0.1 % EX CREA: 1.0000 "application " | TOPICAL_CREAM | Freq: Two times a day (BID) | CUTANEOUS | 0 refills | Status: DC

## 2021-05-12 MED ORDER — CETIRIZINE HCL 10 MG PO TABS: 10.0000 mg | ORAL_TABLET | Freq: Every day | ORAL | 0 refills | Status: DC

## 2021-05-12 NOTE — ED Provider Notes (Signed)
?[facility] ? ? ? ?CSN: [id number] ?Arrival date & time: 05/12/21  1335 ? ? ?  ? ?History   ?Chief Complaint ?Chief Complaint  ?Patient presents with  ? Rash  ? ? ?HPI ?Carmen Lewis is a 46 y.o. female.  ? ?Patient presents with rash to right index finger and right thumb that started approximately 2 weeks ago.  Rash is itchy per patient.  Denies pain to the rash.  Denies purulent drainage from the skin.  Patient has been using over-the-counter itch cream and antibiotic that was prescribed by video visit.  Patient was seen by video visit a few days prior and was prescribed Bactrim, but patient reports that she does not like this medication as it makes her sick so she stopped taking it.  Denies fevers, body aches, chills.  Denies any changes to the environment including lotions, soaps, detergents, foods, etc.  Patient reports that she thinks that a spider is biting her at night during sleep but she has not witnessed this or felt this. ? ? ?Rash ? ?Past Medical History:  ?Diagnosis Date  ? Contraceptive management   ? DVT (deep venous thrombosis) ([facility]) 07/2010  ? right leg  ? Headache disorder   ? Osteoporosis   ? LEFT HIP  ? ? ?Patient Active Problem List  ? Diagnosis Date Noted  ? Snoring 07/21/2018  ? Witnessed episode of apnea 07/21/2018  ? Excessive sleepiness 07/21/2018  ? History of DVT (deep vein thrombosis) 08/21/2015  ? ? ?Past Surgical History:  ?Procedure Laterality Date  ? NO PAST SURGERIES    ? TUBAL LIGATION Bilateral 11/25/2015  ? Procedure: POST PARTUM TUBAL LIGATION, Excision Left Paratubal Cyst;  Surgeon: Lesly Dukes, MD;  Location: [facility];  Service: Gynecology;  Laterality: Bilateral;  ? ? ?OB History   ? ? Gravida  ?5  ? Para  ?3  ? Term  ?3  ? Preterm  ?   ? AB  ?2  ? Living  ?3  ?  ? ? SAB  ?1  ? IAB  ?1  ? Ectopic  ?   ? Multiple  ?0  ? Live Births  ?3  ?   ?  ?  ? ? ? ?Home Medications   ? ?Prior to Admission medications   ?Medication Sig Start Date End Date Taking? Authorizing  Provider  ?cetirizine (ZYRTEC) 10 MG tablet Take 1 tablet (10 mg total) by mouth daily. 05/12/21  Yes Gustavus Bryant, FNP  ?triamcinolone cream (KENALOG) 0.1 % Apply 1 application. topically 2 (two) times daily. 05/12/21  Yes Mann Skaggs, Acie Fredrickson, FNP  ?benzonatate (TESSALON) 100 MG capsule Take 1 capsule (100 mg total) by mouth every 8 (eight) hours as needed for cough. 01/23/21   Gustavus Bryant, FNP  ?oseltamivir (TAMIFLU) 75 MG capsule Take 1 capsule (75 mg total) by mouth every 12 (twelve) hours. 01/23/21   Gustavus Bryant, FNP  ? ? ?Family History ?Family History  ?Problem Relation Age of Onset  ? Hypertension Father   ? Hypertension Maternal Aunt   ? Breast cancer Maternal Aunt 49  ? Hypertension Paternal Uncle   ? Hypertension Maternal Grandmother   ? Hypertension Maternal Grandfather   ? Hypertension Paternal Grandmother   ? Diabetes Mother   ? ? ?Social History ?Social History  ? ?Tobacco Use  ? Smoking status: Never  ? Smokeless tobacco: Never  ?Vaping Use  ? Vaping Use: Never used  ?Substance Use Topics  ? Alcohol  use: No  ? Drug use: No  ? ? ? ?Allergies   ?Other and Latex ? ? ?Review of Systems ?Review of Systems ?Per HPI ? ?Physical Exam ?Triage Vital Signs ?ED Triage Vitals  ?Enc Vitals Group  ?   BP 05/12/21 1422 108/74  ?   Pulse Rate 05/12/21 1422 78  ?   Resp 05/12/21 1422 20  ?   Temp 05/12/21 1422 97.9 ?F (36.6 ?C)  ?   Temp Source 05/12/21 1422 Oral  ?   SpO2 05/12/21 1422 98 %  ?   Weight --   ?   Height --   ?   Head Circumference --   ?   Peak Flow --   ?   Pain Score 05/12/21 1434 4  ?   Pain Loc --   ?   Pain Edu? --   ?   Excl. in [place]? --   ? ?No data found. ? ?Updated Vital Signs ?BP 108/74 (BP Location: Right Arm)   Pulse 78   Temp 97.9 ?F (36.6 ?C) (Oral)   Resp 20   SpO2 98%  ? ?Visual Acuity ?Right Eye Distance:   ?Left Eye Distance:   ?Bilateral Distance:   ? ?Right Eye Near:   ?Left Eye Near:    ?Bilateral Near:    ? ?Physical Exam ?Constitutional:   ?   General: She is not in acute  distress. ?   Appearance: Normal appearance. She is not toxic-appearing or diaphoretic.  ?HENT:  ?   Head: Normocephalic and atraumatic.  ?Eyes:  ?   Extraocular Movements: Extraocular movements intact.  ?   Conjunctiva/sclera: Conjunctivae normal.  ?Pulmonary:  ?   Effort: Pulmonary effort is normal.  ?Skin: ?   Comments: Maculopapular rash present to medial surface of right index finger and pad of right thumb.  No purulent drainage noted.  Neurovascular intact.  Patient has full range of motion of fingers.  ?Neurological:  ?   General: No focal deficit present.  ?   Mental Status: She is alert and oriented to person, place, and time. Mental status is at baseline.  ?Psychiatric:     ?   Mood and Affect: Mood normal.     ?   Behavior: Behavior normal.     ?   Thought Content: Thought content normal.     ?   Judgment: Judgment normal.  ? ? ? ?UC Treatments / Results  ?Labs ?(all labs ordered are listed, but only abnormal results are displayed) ?Labs Reviewed - No data to display ? ?EKG ? ? ?Radiology ?No results found. ? ?Procedures ?Procedures (including critical care time) ? ?Medications Ordered in UC ?Medications - No data to display ? ?Initial Impression / Assessment and Plan / UC Course  ?I have reviewed the triage vital signs and the nursing notes. ? ?Pertinent labs & imaging results that were available during my care of the patient were reviewed by me and considered in my medical decision making (see chart for details). ? ?  ? ?Unsure etiology of patient's rash but it does appear that it could be allergic in nature.  Low suspicion for insect or spider bite.  No signs of bacterial infection or cellulitis at this time.  Will prescribe triamcinolone cream and cetirizine antihistamine to help alleviate symptoms.  Patient was advised to follow-up if symptoms persist or worsen.  Discussed return precautions.  Patient verbalized understanding and was agreeable with plan. ?Final Clinical Impressions(s) / UC Diagnoses   ? ?  Final diagnoses:  ?Rash and nonspecific skin eruption  ? ? ? ?Discharge Instructions   ? ?  ?You have been prescribed 2 medications to alleviate symptoms.  Please follow-up if symptoms persist or worsen. ? ? ? ?ED Prescriptions   ? ? Medication Sig Dispense Auth. Provider  ? triamcinolone cream (KENALOG) 0.1 % Apply 1 application. topically 2 (two) times daily. 30 g Gustavus BryantMound, Jeena Arnett E, [place]  ? cetirizine (ZYRTEC) 10 MG tablet Take 1 tablet (10 mg total) by mouth daily. 30 tablet Ervin KnackMound, Tamico Mundo E, [place]  ? ?  ? ?PDMP not reviewed this encounter. ?  ?Gustavus BryantMound, Keren Alverio E, [place]NP ?05/12/21 1451 ? ?

## 2021-05-12 NOTE — ED Triage Notes (Signed)
Patient presents to Urgent Care with complaints of rash on right index finger and thumb since 2 weeks ago. Patient reports she has been applying OTC anti itch cream and was prescribed antibiotic from [facility] that made her sick and she couldn't take. Pt st it itches and burns. \ ? ?

## 2021-05-12 NOTE — Discharge Instructions (Signed)
You have been prescribed 2 medications to alleviate symptoms.  Please follow-up if symptoms persist or worsen. ?

## 2021-05-28 DIAGNOSIS — I10 Essential (primary) hypertension: Secondary | ICD-10-CM | POA: Diagnosis not present

## 2021-05-28 DIAGNOSIS — G43009 Migraine without aura, not intractable, without status migrainosus: Secondary | ICD-10-CM | POA: Diagnosis not present

## 2021-07-08 DIAGNOSIS — Z1211 Encounter for screening for malignant neoplasm of colon: Secondary | ICD-10-CM | POA: Diagnosis not present

## 2021-07-08 DIAGNOSIS — Z1231 Encounter for screening mammogram for malignant neoplasm of breast: Secondary | ICD-10-CM | POA: Diagnosis not present

## 2021-07-08 DIAGNOSIS — Z6832 Body mass index (BMI) 32.0-32.9, adult: Secondary | ICD-10-CM | POA: Diagnosis not present

## 2021-07-08 DIAGNOSIS — Z124 Encounter for screening for malignant neoplasm of cervix: Secondary | ICD-10-CM | POA: Diagnosis not present

## 2021-07-08 DIAGNOSIS — Z01419 Encounter for gynecological examination (general) (routine) without abnormal findings: Secondary | ICD-10-CM | POA: Diagnosis not present

## 2021-07-08 DIAGNOSIS — Z975 Presence of (intrauterine) contraceptive device: Secondary | ICD-10-CM | POA: Diagnosis not present

## 2021-07-28 ENCOUNTER — Encounter (HOSPITAL_BASED_OUTPATIENT_CLINIC_OR_DEPARTMENT_OTHER): Payer: Self-pay | Admitting: Emergency Medicine

## 2021-07-28 ENCOUNTER — Emergency Department (HOSPITAL_BASED_OUTPATIENT_CLINIC_OR_DEPARTMENT_OTHER)
Admission: EM | Admit: 2021-07-28 | Discharge: 2021-07-29 | Disposition: A | Discharge status: Home / Self Care | Payer: Medicaid Other | Attending: Emergency Medicine | Admitting: Emergency Medicine

## 2021-07-28 ENCOUNTER — Other Ambulatory Visit: Payer: Self-pay

## 2021-07-28 DIAGNOSIS — G43809 Other migraine, not intractable, without status migrainosus: Secondary | ICD-10-CM | POA: Insufficient documentation

## 2021-07-28 DIAGNOSIS — Z91040 Latex allergy status: Secondary | ICD-10-CM | POA: Diagnosis not present

## 2021-07-28 DIAGNOSIS — I10 Essential (primary) hypertension: Secondary | ICD-10-CM | POA: Diagnosis not present

## 2021-07-28 DIAGNOSIS — R030 Elevated blood-pressure reading, without diagnosis of hypertension: Secondary | ICD-10-CM | POA: Diagnosis present

## 2021-07-28 HISTORY — DX: Essential (primary) hypertension: I10

## 2021-07-28 MED ORDER — AMLODIPINE BESYLATE 5 MG PO TABS
5.0000 mg | ORAL_TABLET | Freq: Once | ORAL | Status: AC
  Administered 2021-07-28: 5 mg via ORAL
  Filled 2021-07-28: qty 1

## 2021-07-28 MED ORDER — PROCHLORPERAZINE EDISYLATE 10 MG/2ML IJ SOLN
10.0000 mg | Freq: Once | INTRAMUSCULAR | Status: DC
  Filled 2021-07-28: qty 2

## 2021-07-28 MED ORDER — DIPHENHYDRAMINE HCL 50 MG/ML IJ SOLN
25.0000 mg | Freq: Once | INTRAMUSCULAR | Status: DC
  Filled 2021-07-28: qty 1

## 2021-07-28 NOTE — ED Triage Notes (Signed)
Pt c/o elevated BP at home (158/100); reports hx of htn, denies missing any doses of prescribed lisinopril but reports she does forget to take it sometimes

## 2021-07-28 NOTE — ED Provider Notes (Signed)
[facility] EMERGENCY DEPT Provider Note   CSN: [id number] Arrival date & time: 07/28/21  2206     History  Chief Complaint  Patient presents with   Hypertension    Carmen Lewis is a 46 y.o. female.  HPI     This is a 46 year old female who presents with concerns for high blood pressure.  Patient reports that she was started on blood pressure medication approximately 2 months ago for high blood pressures.  At that time she was having blood pressures 150s over 100s.  She states that she would wake up every morning with a headache.  She reports that she took her lisinopril earlier today.  However, she developed a headache that was mostly left-sided.  She also has a history of migraines and this feels somewhat typical but she took her blood pressure and it was 150s over 110s.  She states that that alarmed her.  She has not noted any weakness, numbness, tingling, vision changes.  She has endorsed some photophobia.  Home Medications Prior to Admission medications   Medication Sig Start Date End Date Taking? Authorizing Provider  benzonatate (TESSALON) 100 MG capsule Take 1 capsule (100 mg total) by mouth every 8 (eight) hours as needed for cough. 01/23/21   Gustavus Bryant, FNP  cetirizine (ZYRTEC) 10 MG tablet Take 1 tablet (10 mg total) by mouth daily. 05/12/21   Gustavus Bryant, FNP  oseltamivir (TAMIFLU) 75 MG capsule Take 1 capsule (75 mg total) by mouth every 12 (twelve) hours. 01/23/21   Gustavus Bryant, FNP  triamcinolone cream (KENALOG) 0.1 % Apply 1 application. topically 2 (two) times daily. 05/12/21   Gustavus Bryant, FNP      Allergies    Other and Latex    Review of Systems   Review of Systems  Constitutional:  Negative for fever.  Neurological:  Positive for headaches.  All other systems reviewed and are negative.   Physical Exam Updated Vital Signs BP (!) 146/91   Pulse 83   Temp 98.6 F (37 C)   Resp 18   Ht 1.626 m (5\' 4" )   Wt 85.3 kg   SpO2 95%    BMI 32.27 kg/m  Physical Exam Vitals and nursing note reviewed.  Constitutional:      Appearance: She is well-developed. She is obese. She is not ill-appearing.  HENT:     Head: Normocephalic and atraumatic.     Mouth/Throat:     Mouth: Mucous membranes are moist.  Eyes:     Extraocular Movements: Extraocular movements intact.     Pupils: Pupils are equal, round, and reactive to light.  Cardiovascular:     Rate and Rhythm: Normal rate and regular rhythm.     Heart sounds: Normal heart sounds.  Pulmonary:     Effort: Pulmonary effort is normal. No respiratory distress.  Abdominal:     Palpations: Abdomen is soft.  Musculoskeletal:     Cervical back: Neck supple.     Right lower leg: No edema.     Left lower leg: No edema.  Skin:    General: Skin is warm and dry.  Neurological:     Mental Status: She is alert and oriented to person, place, and time.     Comments: Cranial nerves II through XII intact, 5 out of 5 strength in all 4 extremities, no dysmetria to finger-nose-finger  Psychiatric:        Mood and Affect: Mood normal.  ED Results / Procedures / Treatments   Labs (all labs ordered are listed, but only abnormal results are displayed) Labs Reviewed - No data to display  EKG None  Radiology No results found.  Procedures Procedures    Medications Ordered in ED Medications  prochlorperazine (COMPAZINE) injection 10 mg (10 mg Intravenous Patient Refused/Not Given 07/29/21 0000)  diphenhydrAMINE (BENADRYL) injection 25 mg (25 mg Intravenous Patient Refused/Not Given 07/28/21 2359)  amLODipine (NORVASC) tablet 5 mg (5 mg Oral Given 07/28/21 2351)    ED Course/ Medical Decision Making/ A&P                           Medical Decision Making Risk Prescription drug management.   This patient presents to the ED for concern of hypertension, headache, this involves an extensive number of treatment options, and is a complaint that carries with it a high risk of  complications and morbidity.  I considered the following differential and admission for this acute, potentially life threatening condition.  The differential diagnosis includes migraine, hypertension, hypertensive urgency, hypertensive emergency, less likely subarachnoid hemorrhage  MDM:    This is a 46 year old female who presents with concerns for headache and high blood pressure.  She is overall nontoxic and vital signs initially notable for blood pressure of 145/100.  She presented mostly because she was concerned regarding the high numbers.  She has a history of migraines as well and did not take her triptan prior to arrival.  She has no signs or symptoms of hypertensive urgency or emergency.  Unclear whether the headache is driving the blood pressure or vice versa.  She was treated with migraine cocktail and 1 dose of Norvasc.  Blood pressure trended downward to 134 over 85.  On recheck, headache has completely improved.  Doubt hypertensive urgency or emergency.  Do not feel she needs advanced imaging as I have low suspicion for subarachnoid hemorrhage.  We discussed keeping a blood pressure log and following up closely with her primary physician regarding medication adjustment.  (Labs, imaging, consults)  Labs: I Ordered, and personally interpreted labs.  The pertinent results include: None  Imaging Studies ordered: I ordered imaging studies including none I independently visualized and interpreted imaging. I agree with the radiologist interpretation  Additional history obtained from chart review.  External records from outside source obtained and reviewed including prior evaluation  Cardiac Monitoring: The patient was maintained on a cardiac monitor.  I personally viewed and interpreted the cardiac monitored which showed an underlying rhythm of: Normal sinus  Reevaluation: After the interventions noted above, I reevaluated the patient and found that they have :resolved  Social  Determinants of Health: Lives independently  Disposition: Discharge  Co morbidities that complicate the patient evaluation  Past Medical History:  Diagnosis Date   Contraceptive management    DVT (deep venous thrombosis) ([facility]) 07/07/2010   right leg   Headache disorder    Hypertension    Osteoporosis    LEFT HIP     Medicines Meds ordered this encounter  Medications   amLODipine (NORVASC) tablet 5 mg   prochlorperazine (COMPAZINE) injection 10 mg   diphenhydrAMINE (BENADRYL) injection 25 mg    I have reviewed the patients home medicines and have made adjustments as needed  Problem List / ED Course: Problem List Items Addressed This Visit   None Visit Diagnoses     Other migraine without status migrainosus, not intractable    -  Primary  Relevant Medications   amLODipine (NORVASC) tablet 5 mg (Completed)   Primary hypertension       Relevant Medications   amLODipine (NORVASC) tablet 5 mg (Completed)                   Final Clinical Impression(s) / ED Diagnoses Final diagnoses:  Other migraine without status migrainosus, not intractable  Primary hypertension    Rx / DC Orders ED Discharge Orders     None         Shon Baton, MD 07/29/21 [phone]

## 2021-07-29 NOTE — Discharge Instructions (Signed)
You were seen today for headache and high blood pressure.  Keep a log of your blood pressures at home.  Follow-up closely with your primary physician for adjustments in your blood pressure medications.

## 2021-08-02 DIAGNOSIS — I10 Essential (primary) hypertension: Secondary | ICD-10-CM | POA: Diagnosis not present

## 2021-08-06 DIAGNOSIS — R1031 Right lower quadrant pain: Secondary | ICD-10-CM | POA: Diagnosis not present

## 2021-08-06 DIAGNOSIS — Z113 Encounter for screening for infections with a predominantly sexual mode of transmission: Secondary | ICD-10-CM | POA: Diagnosis not present

## 2021-09-04 DIAGNOSIS — R7301 Impaired fasting glucose: Secondary | ICD-10-CM | POA: Diagnosis not present

## 2021-09-04 DIAGNOSIS — Z13220 Encounter for screening for lipoid disorders: Secondary | ICD-10-CM | POA: Diagnosis not present

## 2021-09-04 DIAGNOSIS — Z0000 Encounter for general adult medical examination without abnormal findings: Secondary | ICD-10-CM | POA: Diagnosis not present

## 2021-09-04 DIAGNOSIS — F419 Anxiety disorder, unspecified: Secondary | ICD-10-CM | POA: Diagnosis not present

## 2021-09-04 DIAGNOSIS — I10 Essential (primary) hypertension: Secondary | ICD-10-CM | POA: Diagnosis not present

## 2021-09-04 DIAGNOSIS — Z1211 Encounter for screening for malignant neoplasm of colon: Secondary | ICD-10-CM | POA: Diagnosis not present

## 2021-09-16 DIAGNOSIS — Z1211 Encounter for screening for malignant neoplasm of colon: Secondary | ICD-10-CM | POA: Diagnosis not present

## 2021-11-13 DIAGNOSIS — M79645 Pain in left finger(s): Secondary | ICD-10-CM | POA: Insufficient documentation

## 2021-12-06 DIAGNOSIS — I10 Essential (primary) hypertension: Secondary | ICD-10-CM | POA: Diagnosis not present

## 2021-12-06 DIAGNOSIS — R42 Dizziness and giddiness: Secondary | ICD-10-CM | POA: Diagnosis not present

## 2022-04-20 DIAGNOSIS — R42 Dizziness and giddiness: Secondary | ICD-10-CM | POA: Diagnosis not present

## 2022-04-29 ENCOUNTER — Emergency Department (HOSPITAL_BASED_OUTPATIENT_CLINIC_OR_DEPARTMENT_OTHER)
Admission: EM | Admit: 2022-04-29 | Discharge: 2022-04-29 | Disposition: A | Discharge status: Home / Self Care | Payer: Medicaid Other | Attending: Emergency Medicine | Admitting: Emergency Medicine

## 2022-04-29 ENCOUNTER — Other Ambulatory Visit: Payer: Self-pay

## 2022-04-29 ENCOUNTER — Emergency Department (HOSPITAL_BASED_OUTPATIENT_CLINIC_OR_DEPARTMENT_OTHER): Payer: Medicaid Other

## 2022-04-29 ENCOUNTER — Encounter (HOSPITAL_BASED_OUTPATIENT_CLINIC_OR_DEPARTMENT_OTHER): Payer: Self-pay | Admitting: Emergency Medicine

## 2022-04-29 DIAGNOSIS — I159 Secondary hypertension, unspecified: Secondary | ICD-10-CM | POA: Diagnosis not present

## 2022-04-29 DIAGNOSIS — I10 Essential (primary) hypertension: Secondary | ICD-10-CM | POA: Insufficient documentation

## 2022-04-29 DIAGNOSIS — Z91040 Latex allergy status: Secondary | ICD-10-CM | POA: Insufficient documentation

## 2022-04-29 DIAGNOSIS — R519 Headache, unspecified: Secondary | ICD-10-CM | POA: Diagnosis not present

## 2022-04-29 LAB — CBC WITH DIFFERENTIAL/PLATELET
Abs Immature Granulocytes: 0.01 10*3/uL (ref 0.00–0.07)
Basophils Absolute: 0 10*3/uL (ref 0.0–0.1)
Basophils Relative: 1 %
Eosinophils Absolute: 0.1 10*3/uL (ref 0.0–0.5)
Eosinophils Relative: 2 %
HCT: 41.9 % (ref 36.0–46.0)
Hemoglobin: 13.9 g/dL (ref 12.0–15.0)
Immature Granulocytes: 0 %
Lymphocytes Relative: 30 %
Lymphs Abs: 2 10*3/uL (ref 0.7–4.0)
MCH: 30 pg (ref 26.0–34.0)
MCHC: 33.2 g/dL (ref 30.0–36.0)
MCV: 90.3 fL (ref 80.0–100.0)
Monocytes Absolute: 0.6 10*3/uL (ref 0.1–1.0)
Monocytes Relative: 9 %
Neutro Abs: 3.9 10*3/uL (ref 1.7–7.7)
Neutrophils Relative %: 58 %
Platelets: 208 10*3/uL (ref 150–400)
RBC: 4.64 MIL/uL (ref 3.87–5.11)
RDW: 12.5 % (ref 11.5–15.5)
WBC: 6.6 10*3/uL (ref 4.0–10.5)
nRBC: 0 % (ref 0.0–0.2)

## 2022-04-29 LAB — BASIC METABOLIC PANEL
Anion gap: 5 (ref 5–15)
BUN: 16 mg/dL (ref 6–20)
CO2: 28 mmol/L (ref 22–32)
Calcium: 9.8 mg/dL (ref 8.9–10.3)
Chloride: 107 mmol/L (ref 98–111)
Creatinine, Ser: 0.93 mg/dL (ref 0.44–1.00)
GFR, Estimated: >60 mL/min (ref >60)
Glucose, Bld: 90 mg/dL (ref 70–99)
Potassium: 4.2 mmol/L (ref 3.5–5.1)
Sodium: 140 mmol/L (ref 135–145)

## 2022-04-29 MED ORDER — LISINOPRIL 10 MG PO TABS: 10.0000 mg | ORAL_TABLET | Freq: Every day | ORAL | 1 refills | Status: DC

## 2022-04-29 MED ORDER — KETOROLAC TROMETHAMINE 30 MG/ML IJ SOLN
30.0000 mg | Freq: Once | INTRAMUSCULAR | Status: AC
  Administered 2022-04-29: 30 mg via INTRAVENOUS
  Filled 2022-04-29: qty 1

## 2022-04-29 MED ORDER — HYDRALAZINE HCL 20 MG/ML IJ SOLN
5.0000 mg | Freq: Once | INTRAMUSCULAR | Status: AC
  Administered 2022-04-29: 5 mg via INTRAVENOUS
  Filled 2022-04-29: qty 1

## 2022-04-29 MED ORDER — SODIUM CHLORIDE 0.9 % IV BOLUS
500.0000 mL | Freq: Once | INTRAVENOUS | Status: AC
  Administered 2022-04-29: 500 mL via INTRAVENOUS

## 2022-04-29 NOTE — Discharge Instructions (Signed)
You have been seen and discharged from the emergency department.  Your blood work and head CT were normal.  You were given medication in the ER to reduce your blood pressure with good response.  We have decided to place you back on lisinopril, take as directed.  Follow-up with your primary provider for further evaluation and further care. Take home medications as prescribed. If you have any worsening symptoms or further concerns for your health please return to an emergency department for further evaluation.

## 2022-04-29 NOTE — ED Notes (Signed)
Pt given discharge instructions and reviewed prescriptions. Opportunities given for questions. Pt verbalizes understanding. PIV removed x1. Jamelyn Bovard R, RN 

## 2022-04-29 NOTE — ED Triage Notes (Signed)
Pt arrives pov, endorses hypertension recently. Pt reports had been taken off of b/p meds. Reports elevated pressure last night, HA this morning. Pt tearful in triage.

## 2022-04-29 NOTE — ED Provider Notes (Signed)
[place] EMERGENCY DEPARTMENT AT [facility] Provider Note   CSN: [id number] Arrival date & time: 04/29/22  1191     History  No chief complaint on file.   Carmen Lewis is a 47 y.o. female.  HPI   47 year old female with past medical history of HTN presents to the emergency department with concern for high blood pressure and headache.  Patient states that she used to be on lisinopril, was taken off this about 3 months ago due to her blood pressure doing well.  However the last couple days she has felt fatigued with a generalized headache.  2 days ago she states he had an episode of blurred vision but this resolved.  When she checked her blood pressure at home she noted that the top number was greater than 160 which is unusual for her.  She states she is typically between 120 and 130.  She denies any chest pain, shortness of breath, swelling of her lower extremities.  Denies any facial asymmetry, focal weakness/numbness.  Home Medications Prior to Admission medications   Medication Sig Start Date End Date Taking? Authorizing Provider  benzonatate (TESSALON) 100 MG capsule Take 1 capsule (100 mg total) by mouth every 8 (eight) hours as needed for cough. 01/23/21   Gustavus Bryant, FNP  cetirizine (ZYRTEC) 10 MG tablet Take 1 tablet (10 mg total) by mouth daily. 05/12/21   Gustavus Bryant, FNP  oseltamivir (TAMIFLU) 75 MG capsule Take 1 capsule (75 mg total) by mouth every 12 (twelve) hours. 01/23/21   Gustavus Bryant, FNP  triamcinolone cream (KENALOG) 0.1 % Apply 1 application. topically 2 (two) times daily. 05/12/21   Gustavus Bryant, FNP      Allergies    Other and Latex    Review of Systems   Review of Systems  Constitutional:  Positive for fatigue.  HENT:  Negative for congestion.   Eyes:  Positive for visual disturbance.  Respiratory:  Negative for chest tightness and shortness of breath.   Cardiovascular:  Negative for chest pain, palpitations and leg swelling.   Gastrointestinal:  Negative for nausea and vomiting.  Musculoskeletal:  Negative for back pain.  Neurological:  Positive for headaches. Negative for dizziness and facial asymmetry.    Physical Exam Updated Vital Signs BP (!) 175/95   Pulse 66   Temp 98.7 F (37.1 C) (Oral)   Resp 20   Ht  (1.626 m)   Wt 81.6 kg   SpO2 99%   BMI 30.90 kg/m  Physical Exam Vitals and nursing note reviewed.  Constitutional:      General: She is not in acute distress.    Appearance: Normal appearance.  HENT:     Head: Normocephalic.     Mouth/Throat:     Mouth: Mucous membranes are moist.  Eyes:     Extraocular Movements: Extraocular movements intact.     Conjunctiva/sclera: Conjunctivae normal.     Pupils: Pupils are equal, round, and reactive to light.  Cardiovascular:     Rate and Rhythm: Normal rate.  Pulmonary:     Effort: Pulmonary effort is normal. No respiratory distress.  Abdominal:     Palpations: Abdomen is soft.     Tenderness: There is no abdominal tenderness.  Musculoskeletal:        General: No swelling.     Cervical back: No rigidity.  Skin:    General: Skin is warm.  Neurological:     Mental Status: She is alert and  oriented to person, place, and time. Mental status is at baseline.     Cranial Nerves: No cranial nerve deficit.  Psychiatric:        Mood and Affect: Mood normal.     ED Results / Procedures / Treatments   Labs (all labs ordered are listed, but only abnormal results are displayed) Labs Reviewed  CBC WITH DIFFERENTIAL/PLATELET  BASIC METABOLIC PANEL    EKG EKG Interpretation  Date/Time:  Tuesday April 29 2022 07:28:47 EDT Ventricular Rate:  60 PR Interval:  163 QRS Duration: 79 QT Interval:  455 QTC Calculation: 455 R Axis:   -17 Text Interpretation: Sinus rhythm Borderline left axis deviation Probable anteroseptal infarct, old Similar to previous Confirmed by Coralee Pesa [phone]) on 04/29/2022 7:30:29 AM  Radiology No results  found.  Procedures Procedures    Medications Ordered in ED Medications  hydrALAZINE (APRESOLINE) injection 5 mg (has no administration in time range)    ED Course/ Medical Decision Making/ A&P                             Medical Decision Making Amount and/or Complexity of Data Reviewed Labs: ordered. Radiology: ordered.  Risk Prescription drug management.   47 year old female presents the emergency department with concern for high blood pressure, headache.  Patient states she was recently taken off of lisinopril.  Over the past couple days she has felt fatigued, headache with an episode of blurred vision which is currently resolved.  No active chest pain, shortness of breath or swelling of her lower extremities.  She is hypertensive on arrival.  EKG is similar to previous.  Head CT shows no acute finding.  Blood work is normal.  In the absence of chest pain/shortness of breath did not feel ACS workup was warranted.  After dose of medicine blood pressure responded well.  On reevaluation she is headache free, feels improved.  Will plan to place her back on her lisinopril dose and recommend outpatient follow-up.  Patient at this time appears safe and stable for discharge and close outpatient follow up. Discharge plan and strict return to ED precautions discussed, patient verbalizes understanding and agreement.        Final Clinical Impression(s) / ED Diagnoses Final diagnoses:  None    Rx / DC Orders ED Discharge Orders     None         Rozelle Logan, DO 04/29/22 5621

## 2022-05-07 DIAGNOSIS — F419 Anxiety disorder, unspecified: Secondary | ICD-10-CM | POA: Diagnosis not present

## 2022-05-07 DIAGNOSIS — R7303 Prediabetes: Secondary | ICD-10-CM | POA: Diagnosis not present

## 2022-05-07 DIAGNOSIS — I10 Essential (primary) hypertension: Secondary | ICD-10-CM | POA: Diagnosis not present

## 2022-06-20 DIAGNOSIS — I10 Essential (primary) hypertension: Secondary | ICD-10-CM | POA: Diagnosis not present

## 2022-06-20 DIAGNOSIS — R002 Palpitations: Secondary | ICD-10-CM | POA: Diagnosis not present

## 2022-07-15 DIAGNOSIS — Z1231 Encounter for screening mammogram for malignant neoplasm of breast: Secondary | ICD-10-CM | POA: Diagnosis not present

## 2022-07-15 DIAGNOSIS — R87810 Cervical high risk human papillomavirus (HPV) DNA test positive: Secondary | ICD-10-CM | POA: Diagnosis not present

## 2022-07-15 DIAGNOSIS — Z1211 Encounter for screening for malignant neoplasm of colon: Secondary | ICD-10-CM | POA: Diagnosis not present

## 2022-07-15 DIAGNOSIS — Z113 Encounter for screening for infections with a predominantly sexual mode of transmission: Secondary | ICD-10-CM | POA: Diagnosis not present

## 2022-07-15 DIAGNOSIS — N898 Other specified noninflammatory disorders of vagina: Secondary | ICD-10-CM | POA: Diagnosis not present

## 2022-07-15 DIAGNOSIS — Z139 Encounter for screening, unspecified: Secondary | ICD-10-CM | POA: Diagnosis not present

## 2022-07-15 DIAGNOSIS — Z01419 Encounter for gynecological examination (general) (routine) without abnormal findings: Secondary | ICD-10-CM | POA: Diagnosis not present

## 2022-07-17 DIAGNOSIS — R002 Palpitations: Secondary | ICD-10-CM | POA: Insufficient documentation

## 2022-07-17 NOTE — Progress Notes (Signed)
Patient referred by Aliene Beams, MD for palpitations  Subjective:   Carmen Lewis, female    DOB: 01/24/1975, 47 y.o.   MRN: [id number]   Chief Complaint  Patient presents with   Palpitations   New Patient (Initial Visit)    Referred by Sanda Klein, PA    HPI  47 y.o. African American female with hypertension, h/o DVT, referred for palpitations  Patient works as Engineer, site at [employer]. She reports what she describes as "heart hiccups", lasting for a second or two, occur several times a day. She denies any associated chest pain, dyspnea symptoms. She drinks 1 cup of coffee a day, does not drink alcohol usually.    Past Medical History:  Diagnosis Date   Contraceptive management    DVT (deep venous thrombosis) (HCC) 07/07/2010   right leg   Headache disorder    Hypertension    Osteoporosis    LEFT HIP     Past Surgical History:  Procedure Laterality Date   NO PAST SURGERIES     TUBAL LIGATION Bilateral 11/25/2015   Procedure: POST PARTUM TUBAL LIGATION, Excision Left Paratubal Cyst;  Surgeon: Lesly Dukes, MD;  Location: [facility];  Service: Gynecology;  Laterality: Bilateral;     Social History   Tobacco Use  Smoking Status Never  Smokeless Tobacco Never    Social History   Substance and Sexual Activity  Alcohol Use No     Family History  Problem Relation Age of Onset   Hypertension Father    Hypertension Maternal Aunt    Breast cancer Maternal Aunt 49   Hypertension Paternal Uncle    Hypertension Maternal Grandmother    Hypertension Maternal Grandfather    Hypertension Paternal Grandmother    Diabetes Mother       Current Outpatient Medications:    benzonatate (TESSALON) 100 MG capsule, Take 1 capsule (100 mg total) by mouth every 8 (eight) hours as needed for cough., Disp: 21 capsule, Rfl: 0   cetirizine (ZYRTEC) 10 MG tablet, Take 1 tablet (10 mg total) by mouth daily., Disp: 30 tablet, Rfl: 0   lisinopril (ZESTRIL) 10 MG  tablet, Take 1 tablet (10 mg total) by mouth daily., Disp: 30 tablet, Rfl: 1   oseltamivir (TAMIFLU) 75 MG capsule, Take 1 capsule (75 mg total) by mouth every 12 (twelve) hours., Disp: 10 capsule, Rfl: 0   triamcinolone cream (KENALOG) 0.1 %, Apply 1 application. topically 2 (two) times daily., Disp: 30 g, Rfl: 0   Cardiovascular and other pertinent studies:  Reviewed external labs and tests, independently interpreted  EKG 07/18/2022: Sinus rhythm 60 bpm  Old anteroseptal infarct   Recent labs: 04/29/2022: Glucose 90, BUN/Cr 16/0.93. EGFR >60. Na/K 140/4.2.  H/H 13/41. MCV 90. Platelets 208    Review of Systems  Cardiovascular:  Positive for palpitations. Negative for chest pain, dyspnea on exertion, leg swelling and syncope.         Vitals:   07/18/22 1350  BP: (!) 145/84  Pulse: 88  Resp: 16  SpO2: 96%     Body mass index is 33.16 kg/m. Filed Weights   07/18/22 1350  Weight: 193 lb 3.2 oz (87.6 kg)     Objective:   Physical Exam Vitals and nursing note reviewed.  Constitutional:      General: She is not in acute distress. Neck:     Vascular: No JVD.  Cardiovascular:     Rate and Rhythm: Normal rate and regular rhythm.  Heart sounds: Normal heart sounds. No murmur heard. Pulmonary:     Effort: Pulmonary effort is normal.     Breath sounds: Normal breath sounds. No wheezing or rales.  Musculoskeletal:     Right lower leg: No edema.     Left lower leg: No edema.          Visit diagnoses:   ICD-10-CM   1. Palpitations  R00.2 EKG 12-Lead    LONG TERM MONITOR (3-14 DAYS)    PCV ECHOCARDIOGRAM COMPLETE    2. Essential hypertension  I10 PCV ECHOCARDIOGRAM COMPLETE       Orders Placed This Encounter  Procedures   LONG TERM MONITOR (3-14 DAYS)   EKG 12-Lead   PCV ECHOCARDIOGRAM COMPLETE       Meds ordered this encounter  Medications   metoprolol tartrate (LOPRESSOR) 25 MG tablet    Sig: Take 1 tablet (25 mg total) by mouth 2 (two)  times daily.    Dispense:  60 tablet    Refill:  3     Assessment & Recommendations:   47 y.o. African American female with hypertension, h/o DVT, referred for palpitations  Palpitations: Likely PAC or PVC. Check 1 week cardiac telemetry. Given her symptoms, will start metoprolol tartrate 25 mg bid.   Hypertension: Check echocardiogram.  Further recommendations after above testing  Thank you for referring the patient to [place]. Please feel free to contact with any questions.   Elder Negus, MD Pager: [phone] Office: [phone]

## 2022-07-18 ENCOUNTER — Encounter: Payer: Self-pay | Admitting: Cardiology

## 2022-07-18 ENCOUNTER — Other Ambulatory Visit: Payer: Medicaid Other

## 2022-07-18 ENCOUNTER — Ambulatory Visit: Payer: Medicaid Other | Admitting: Cardiology

## 2022-07-18 VITALS — BP 145/84 | HR 88 | Resp 16 | Ht 64.0 in | Wt 193.2 lb

## 2022-07-18 DIAGNOSIS — R002 Palpitations: Secondary | ICD-10-CM | POA: Diagnosis not present

## 2022-07-18 DIAGNOSIS — I10 Essential (primary) hypertension: Secondary | ICD-10-CM | POA: Insufficient documentation

## 2022-07-18 MED ORDER — METOPROLOL TARTRATE 25 MG PO TABS: 25.0000 mg | ORAL_TABLET | Freq: Two times a day (BID) | ORAL | 3 refills | Status: AC

## 2022-07-31 ENCOUNTER — Ambulatory Visit: Payer: Medicaid Other

## 2022-07-31 DIAGNOSIS — I10 Essential (primary) hypertension: Secondary | ICD-10-CM | POA: Diagnosis not present

## 2022-07-31 DIAGNOSIS — R7303 Prediabetes: Secondary | ICD-10-CM | POA: Diagnosis not present

## 2022-07-31 DIAGNOSIS — R002 Palpitations: Secondary | ICD-10-CM | POA: Diagnosis not present

## 2022-07-31 DIAGNOSIS — E669 Obesity, unspecified: Secondary | ICD-10-CM | POA: Diagnosis not present

## 2022-08-03 ENCOUNTER — Ambulatory Visit (HOSPITAL_BASED_OUTPATIENT_CLINIC_OR_DEPARTMENT_OTHER): Admit: 2022-08-03 | Payer: Medicaid Other

## 2022-08-03 ENCOUNTER — Emergency Department (HOSPITAL_BASED_OUTPATIENT_CLINIC_OR_DEPARTMENT_OTHER): Payer: Medicaid Other

## 2022-08-03 ENCOUNTER — Emergency Department (HOSPITAL_BASED_OUTPATIENT_CLINIC_OR_DEPARTMENT_OTHER)
Admission: EM | Admit: 2022-08-03 | Discharge: 2022-08-03 | Disposition: A | Discharge status: Home / Self Care | Payer: Medicaid Other | Attending: Emergency Medicine | Admitting: Emergency Medicine

## 2022-08-03 ENCOUNTER — Other Ambulatory Visit: Payer: Self-pay

## 2022-08-03 ENCOUNTER — Encounter (HOSPITAL_BASED_OUTPATIENT_CLINIC_OR_DEPARTMENT_OTHER): Payer: Self-pay | Admitting: Emergency Medicine

## 2022-08-03 DIAGNOSIS — Z91040 Latex allergy status: Secondary | ICD-10-CM | POA: Insufficient documentation

## 2022-08-03 DIAGNOSIS — Z79899 Other long term (current) drug therapy: Secondary | ICD-10-CM | POA: Diagnosis not present

## 2022-08-03 DIAGNOSIS — R519 Headache, unspecified: Secondary | ICD-10-CM | POA: Diagnosis not present

## 2022-08-03 DIAGNOSIS — I160 Hypertensive urgency: Secondary | ICD-10-CM | POA: Insufficient documentation

## 2022-08-03 DIAGNOSIS — I10 Essential (primary) hypertension: Secondary | ICD-10-CM | POA: Insufficient documentation

## 2022-08-03 LAB — BASIC METABOLIC PANEL
Anion gap: 5 (ref 5–15)
BUN: 20 mg/dL (ref 6–20)
CO2: 28 mmol/L (ref 22–32)
Calcium: 9.1 mg/dL (ref 8.9–10.3)
Chloride: 105 mmol/L (ref 98–111)
Creatinine, Ser: 0.84 mg/dL (ref 0.44–1.00)
GFR, Estimated: >60 mL/min (ref >60)
Glucose, Bld: 98 mg/dL (ref 70–99)
Potassium: 3.8 mmol/L (ref 3.5–5.1)
Sodium: 138 mmol/L (ref 135–145)

## 2022-08-03 LAB — CBC WITH DIFFERENTIAL/PLATELET
Abs Immature Granulocytes: 0.01 10*3/uL (ref 0.00–0.07)
Basophils Absolute: 0 10*3/uL (ref 0.0–0.1)
Basophils Relative: 0 %
Eosinophils Absolute: 0.2 10*3/uL (ref 0.0–0.5)
Eosinophils Relative: 2 %
HCT: 38.8 % (ref 36.0–46.0)
Hemoglobin: 13.2 g/dL (ref 12.0–15.0)
Immature Granulocytes: 0 %
Lymphocytes Relative: 25 %
Lymphs Abs: 2 10*3/uL (ref 0.7–4.0)
MCH: 30.5 pg (ref 26.0–34.0)
MCHC: 34 g/dL (ref 30.0–36.0)
MCV: 89.6 fL (ref 80.0–100.0)
Monocytes Absolute: 0.7 10*3/uL (ref 0.1–1.0)
Monocytes Relative: 9 %
Neutro Abs: 5 10*3/uL (ref 1.7–7.7)
Neutrophils Relative %: 64 %
Platelets: 216 10*3/uL (ref 150–400)
RBC: 4.33 MIL/uL (ref 3.87–5.11)
RDW: 13 % (ref 11.5–15.5)
WBC: 7.9 10*3/uL (ref 4.0–10.5)
nRBC: 0 % (ref 0.0–0.2)

## 2022-08-03 LAB — TROPONIN I (HIGH SENSITIVITY): Troponin I (High Sensitivity): <2 ng/L (ref <18)

## 2022-08-03 NOTE — ED Triage Notes (Signed)
  Patient comes in with hypertension and headache that started yesterday around 1830.  Patient states she was recently switched from lisinopril to olmesartan.  Patient states pain is 8/10.  Denies any chest pain or SOB.

## 2022-08-03 NOTE — ED Notes (Signed)
Work note provided.

## 2022-08-03 NOTE — ED Notes (Addendum)
Pt is going to return tonight for a CT scan of her neck at 6pm. This has been scheduled with CT and pt is aware of this. Voiced understanding of d/c instructions.

## 2022-08-03 NOTE — ED Provider Notes (Signed)
[place] EMERGENCY DEPARTMENT AT [facility] Provider Note   CSN: [id number] Arrival date & time: 08/03/22  0350     History  Chief Complaint  Patient presents with   Hypertension   Headache    Carmen Lewis is a 47 y.o. female.  Patient is a 47 year old female with past medical history of hypertension, migraines, prior DVT.  Presenting today for evaluation of elevated blood pressure and headache.  She reports blood pressures in the 160s before going to bed, then woke up in the night with a headache and blood pressure still elevated.  She denies any chest pain or difficulty breathing.  She tells me she recently had a change in her medicine from lisinopril to Benicar, but reports being compliant with her medications.  The history is provided by the patient.       Home Medications Prior to Admission medications   Medication Sig Start Date End Date Taking? Authorizing Provider  metoprolol tartrate (LOPRESSOR) 25 MG tablet Take 1 tablet (25 mg total) by mouth 2 (two) times daily. 07/18/22 10/16/22  Patwardhan, Anabel Bene, MD  olmesartan (BENICAR) 20 MG tablet Take 10 mg by mouth daily.    [provider]  sertraline (ZOLOFT) 25 MG tablet Take 25 mg by mouth daily.    [provider]  SUMAtriptan (IMITREX) 50 MG tablet Take 50 mg by mouth every 2 (two) hours as needed. 06/25/18   [provider]      Allergies    Other and Latex    Review of Systems   Review of Systems  All other systems reviewed and are negative.   Physical Exam Updated Vital Signs BP (!) 139/94 (BP Location: Right Arm)   Pulse 83   Temp 98.2 F (36.8 C) (Oral)   Resp 20   Ht 5\' 4"  (1.626 m)   Wt 87.5 kg   SpO2 97%   BMI 33.13 kg/m  Physical Exam Vitals and nursing note reviewed.  Constitutional:      General: She is not in acute distress.    Appearance: She is well-developed. She is not diaphoretic.  HENT:     Head: Normocephalic and atraumatic.   Cardiovascular:     Rate and Rhythm: Normal rate and regular rhythm.     Heart sounds: No murmur heard.    No friction rub. No gallop.  Pulmonary:     Effort: Pulmonary effort is normal. No respiratory distress.     Breath sounds: Normal breath sounds. No wheezing.  Abdominal:     General: Bowel sounds are normal. There is no distension.     Palpations: Abdomen is soft.     Tenderness: There is no abdominal tenderness.  Musculoskeletal:        General: Normal range of motion.     Cervical back: Normal range of motion and neck supple.  Skin:    General: Skin is warm and dry.  Neurological:     General: No focal deficit present.     Mental Status: She is alert and oriented to person, place, and time.     Cranial Nerves: No cranial nerve deficit, dysarthria or facial asymmetry.     ED Results / Procedures / Treatments   Labs (all labs ordered are listed, but only abnormal results are displayed) Labs Reviewed  BASIC METABOLIC PANEL  CBC WITH DIFFERENTIAL/PLATELET  TROPONIN I (HIGH SENSITIVITY)    EKG EKG Interpretation Date/Time:  Sunday August 03 2022 04:24:58 EDT Ventricular Rate:  31  PR Interval:  181 QRS Duration:  83 QT Interval:  394 QTC Calculation: 435 R Axis:   14  Text Interpretation: Sinus rhythm Low voltage, precordial leads Probable anteroseptal infarct, old No significant change since 04/29/2022 Confirmed by Geoffery Lyons ([id number]) on 08/03/2022 4:37:03 AM  Radiology No results found.  Procedures Procedures    Medications Ordered in ED Medications - No data to display  ED Course/ Medical Decision Making/ A&P  Patient is a 47 year old female presenting with headache and elevated blood pressure as described in the HPI.  She arrives here with stable vital signs, is normotensive, and physical examination is unremarkable.  Workup initiated including CBC, metabolic panel, and troponin.  All the studies are unremarkable.  CT scan of the head obtained showing  no acute intracranial process.  There was a possible abnormality with salivary gland for which a nonemergent CT of the neck was recommended.  Upon most recent check, blood pressure was 123/93 without intervention.  I feel as though patient can safely be discharged with monitoring of her blood pressure at home and follow-up with primary doctor.  Final Clinical Impression(s) / ED Diagnoses Final diagnoses:  None    Rx / DC Orders ED Discharge Orders     None         Geoffery Lyons, MD 08/03/22 [phone]

## 2022-08-03 NOTE — Discharge Instructions (Signed)
Take ibuprofen 600 mg every 6 hours as needed for headache.  Monitor your blood pressures at home.  Make a list of the results and take this to your primary doctor for follow-up.  There is also a potential abnormality with the parotid gland noted on the CT scan.  The radiologist has recommended nonemergent CT scan of the neck.  This will be ordered and follow the results up with your primary doctor.

## 2022-08-06 DIAGNOSIS — R002 Palpitations: Secondary | ICD-10-CM | POA: Diagnosis not present

## 2022-08-18 ENCOUNTER — Emergency Department (HOSPITAL_BASED_OUTPATIENT_CLINIC_OR_DEPARTMENT_OTHER)
Admission: EM | Admit: 2022-08-18 | Discharge: 2022-08-18 | Disposition: A | Discharge status: Home / Self Care | Payer: Medicaid Other | Attending: Emergency Medicine | Admitting: Emergency Medicine

## 2022-08-18 ENCOUNTER — Encounter (HOSPITAL_BASED_OUTPATIENT_CLINIC_OR_DEPARTMENT_OTHER): Payer: Self-pay

## 2022-08-18 ENCOUNTER — Other Ambulatory Visit: Payer: Self-pay

## 2022-08-18 DIAGNOSIS — Z5321 Procedure and treatment not carried out due to patient leaving prior to being seen by health care provider: Secondary | ICD-10-CM | POA: Insufficient documentation

## 2022-08-18 DIAGNOSIS — R309 Painful micturition, unspecified: Secondary | ICD-10-CM | POA: Insufficient documentation

## 2022-08-18 DIAGNOSIS — N898 Other specified noninflammatory disorders of vagina: Secondary | ICD-10-CM | POA: Diagnosis not present

## 2022-08-18 DIAGNOSIS — Z202 Contact with and (suspected) exposure to infections with a predominantly sexual mode of transmission: Secondary | ICD-10-CM

## 2022-08-18 LAB — URINALYSIS, ROUTINE W REFLEX MICROSCOPIC
Bacteria, UA: NONE SEEN
Bilirubin Urine: NEGATIVE
Glucose, UA: NEGATIVE mg/dL
Hgb urine dipstick: NEGATIVE
Ketones, ur: NEGATIVE mg/dL
Nitrite: NEGATIVE
Specific Gravity, Urine: 1.025 (ref 1.005–1.030)
pH: 7.5 (ref 5.0–8.0)

## 2022-08-18 NOTE — ED Triage Notes (Signed)
Pt states her spouse was cheating, c/o "weird" discharge, burning during urination. States she had physical 2wks ago, reports dx of bacterial infection & tx w meds.  Last intercourse Wednesday- "Saturday morning, like what is this?"

## 2022-08-18 NOTE — ED Notes (Signed)
Received report from Kendall, Charity fundraiser. Awaiting provider evaluation. Patient resting quietly in stretcher, respirations even, unlabored, no acute distress noted. Denies needs at this time.

## 2022-08-18 NOTE — ED Notes (Signed)
Patient eloped, stated she needs to go feed her children.

## 2022-08-20 ENCOUNTER — Ambulatory Visit: Payer: Medicaid Other | Admitting: Cardiology

## 2022-08-22 ENCOUNTER — Other Ambulatory Visit: Payer: Self-pay | Admitting: Physician Assistant

## 2022-08-22 ENCOUNTER — Encounter (HOSPITAL_BASED_OUTPATIENT_CLINIC_OR_DEPARTMENT_OTHER): Payer: Self-pay | Admitting: Internal Medicine

## 2022-08-22 DIAGNOSIS — K118 Other diseases of salivary glands: Secondary | ICD-10-CM

## 2022-08-25 ENCOUNTER — Ambulatory Visit: Payer: Medicaid Other | Admitting: Cardiology

## 2022-08-25 NOTE — Progress Notes (Signed)
Patient referred by Aliene Beams, MD for palpitations  Subjective:   Carmen Lewis, female    DOB: 06/05/1975, 47 y.o.   MRN: [id number]   No chief complaint on file.   HPI  47 y.o. African American female with hypertension, h/o DVT, referred for palpitations  ***  Initial consultation visit ***: Patient works as Engineer, site at [employer]. She reports what she describes as "heart hiccups", lasting for a second or two, occur several times a day. She denies any associated chest pain, dyspnea symptoms. She drinks 1 cup of coffee a day, does not drink alcohol usually.    Current Outpatient Medications:    metoprolol tartrate (LOPRESSOR) 25 MG tablet, Take 1 tablet (25 mg total) by mouth 2 (two) times daily., Disp: 60 tablet, Rfl: 3   olmesartan (BENICAR) 20 MG tablet, Take 10 mg by mouth daily., Disp: , Rfl:    sertraline (ZOLOFT) 25 MG tablet, Take 25 mg by mouth daily., Disp: , Rfl:    SUMAtriptan (IMITREX) 50 MG tablet, Take 50 mg by mouth every 2 (two) hours as needed., Disp: , Rfl:    Cardiovascular and other pertinent studies:  Reviewed external labs and tests, independently interpreted  EKG 07/18/2022: Sinus rhythm 60 bpm  Old anteroseptal infarct  Echocardiogram 07/31/2022: Left ventricle cavity is normal in size. Normal left ventricular wall thickness. Normal global wall motion. Normal LV systolic function with EF 59%. Normal diastolic filling pattern. No significant valvular abnormality. Normal right atrial pressure.  Mobile cardiac telemetry 5 days 07/18/2022 - 07/24/2022: Dominant rhythm: Sinus. HR 48-137 bpm. Avg HR 80 bpm. 0 episodes of SVT. <1% isolated SVE, couplets. 0 episodes of VT. <1% isolated VE, no couplet/triplets. No atrial fibrillation/atrial flutter/SVT/VT/high grade AV block, sinus pause >3sec noted. 0 patient triggered events.   Recent labs: 04/29/2022: Glucose 90, BUN/Cr 16/0.93. EGFR >60. Na/K 140/4.2.  H/H 13/41. MCV 90.  Platelets 208    Review of Systems  Cardiovascular:  Positive for palpitations. Negative for chest pain, dyspnea on exertion, leg swelling and syncope.         There were no vitals filed for this visit.    There is no height or weight on file to calculate BMI. There were no vitals filed for this visit.    Objective:   Physical Exam Vitals and nursing note reviewed.  Constitutional:      General: She is not in acute distress. Neck:     Vascular: No JVD.  Cardiovascular:     Rate and Rhythm: Normal rate and regular rhythm.     Heart sounds: Normal heart sounds. No murmur heard. Pulmonary:     Effort: Pulmonary effort is normal.     Breath sounds: Normal breath sounds. No wheezing or rales.  Musculoskeletal:     Right lower leg: No edema.     Left lower leg: No edema.          Visit diagnoses: No diagnosis found.    No orders of the defined types were placed in this encounter.      No orders of the defined types were placed in this encounter.    Assessment & Recommendations:   47 y.o. African American female with hypertension, h/o DVT, referred for palpitations  Palpitations: Likely PAC or PVC. Check 1 week cardiac telemetry. Given her symptoms, will start metoprolol tartrate 25 mg bid.   Hypertension: Check echocardiogram.  Further recommendations after above testing  Thank you for referring the patient to  [place]. Please feel free to contact with any questions.   Elder Negus, MD Pager: [phone] Office: [phone]

## 2022-08-26 ENCOUNTER — Ambulatory Visit
Admission: RE | Admit: 2022-08-26 | Discharge: 2022-08-26 | Disposition: A | Discharge status: Home / Self Care | Payer: Medicaid Other | Source: Ambulatory Visit | Attending: Physician Assistant | Admitting: Physician Assistant

## 2022-08-26 DIAGNOSIS — K118 Other diseases of salivary glands: Secondary | ICD-10-CM | POA: Diagnosis not present

## 2022-08-26 MED ORDER — IOPAMIDOL (ISOVUE-300) INJECTION 61%
200.0000 mL | Freq: Once | INTRAVENOUS | Status: AC | PRN
  Administered 2022-08-26: 75 mL via INTRAVENOUS

## 2022-09-03 ENCOUNTER — Ambulatory Visit
Admission: EM | Admit: 2022-09-03 | Discharge: 2022-09-03 | Disposition: A | Discharge status: Home / Self Care | Payer: Medicaid Other | Attending: Internal Medicine | Admitting: Internal Medicine

## 2022-09-03 DIAGNOSIS — K120 Recurrent oral aphthae: Secondary | ICD-10-CM | POA: Diagnosis not present

## 2022-09-03 DIAGNOSIS — Z113 Encounter for screening for infections with a predominantly sexual mode of transmission: Secondary | ICD-10-CM | POA: Insufficient documentation

## 2022-09-03 NOTE — ED Triage Notes (Addendum)
Pt presents with c/o mouth soreness. States she noticed red spots inside of her mouth. Reports she recently had oral sex with her partner and found out her partner was having an affair.

## 2022-09-03 NOTE — Discharge Instructions (Signed)
The clinic will contact you with results of the STD testing done today if positive.  I would use the braces wax to help protect your inner lips from any additional injury.  Follow-up with your PCP as needed.

## 2022-09-03 NOTE — ED Provider Notes (Signed)
[facility]    CSN: [id number] Arrival date & time: 09/03/22  1805      History   Chief Complaint Chief Complaint  Patient presents with   Mouth Lesions    HPI Carmen Lewis is a 47 y.o. female presents for evaluation of a mouth sore.  Patient reports a couple days of some painful sores on the inner aspect of both upper and lower lips.  She found out her significant other has been unfaithful and she is concerned for STDs.  No known exposure.  Denies any vaginal discharge or dysuria.  She does have braces and is not sure if the symptoms she is having in her mouth are secondary to that.  No other concerns at this time.   Mouth Lesions   Past Medical History:  Diagnosis Date   Contraceptive management    DVT (deep venous thrombosis) (HCC) 07/07/2010   right leg   Headache disorder    Hypertension    Osteoporosis    LEFT HIP    Patient Active Problem List   Diagnosis Date Noted   Essential hypertension 07/18/2022   Palpitations 07/17/2022   Snoring 07/21/2018   Witnessed episode of apnea 07/21/2018   Excessive sleepiness 07/21/2018   History of DVT (deep vein thrombosis) 08/21/2015    Past Surgical History:  Procedure Laterality Date   BREAST BIOPSY Left    NO PAST SURGERIES     TUBAL LIGATION Bilateral 11/25/2015   Procedure: POST PARTUM TUBAL LIGATION, Excision Left Paratubal Cyst;  Surgeon: Lesly Dukes, MD;  Location: [facility];  Service: Gynecology;  Laterality: Bilateral;    OB History     Gravida  5   Para  3   Term  3   Preterm      AB  2   Living  3      SAB  1   IAB  1   Ectopic      Multiple  0   Live Births  3            Home Medications    Prior to Admission medications   Medication Sig Start Date End Date Taking? Authorizing Provider  metoprolol tartrate (LOPRESSOR) 25 MG tablet Take 1 tablet (25 mg total) by mouth 2 (two) times daily. 07/18/22 10/16/22  Patwardhan, Anabel Bene, MD  olmesartan (BENICAR) 20 MG  tablet Take 10 mg by mouth daily.    [provider]  sertraline (ZOLOFT) 25 MG tablet Take 25 mg by mouth daily.    [provider]  SUMAtriptan (IMITREX) 50 MG tablet Take 50 mg by mouth every 2 (two) hours as needed. 06/25/18   [provider]    Family History Family History  Problem Relation Age of Onset   Diabetes Mother    Hypertension Father    Hypertension Maternal Aunt    Breast cancer Maternal Aunt 54   Hypertension Paternal Uncle    Hypertension Maternal Grandmother    Hypertension Maternal Grandfather    Hypertension Paternal Grandmother     Social History Social History   Tobacco Use   Smoking status: Never   Smokeless tobacco: Never  Vaping Use   Vaping status: Never Used  Substance Use Topics   Alcohol use: No   Drug use: No     Allergies   Other and Latex   Review of Systems Review of Systems  HENT:  Positive for mouth sores.      Physical Exam Triage  Vital Signs ED Triage Vitals  Encounter Vitals Group     BP 09/03/22 1839 (!) 150/96     Systolic BP Percentile --      Diastolic BP Percentile --      Pulse Rate 09/03/22 1838 63     Resp 09/03/22 1838 17     Temp 09/03/22 1838 98.1 F (36.7 C)     Temp Source 09/03/22 1838 Oral     SpO2 09/03/22 1838 98 %     Weight --      Height --      Head Circumference --      Peak Flow --      Pain Score --      Pain Loc --      Pain Education --      Exclude from Growth Chart --    No data found.  Updated Vital Signs BP (!) 150/96   Pulse 63   Temp 98.1 F (36.7 C) (Oral)   Resp 17   SpO2 98%   Visual Acuity Right Eye Distance:   Left Eye Distance:   Bilateral Distance:    Right Eye Near:   Left Eye Near:    Bilateral Near:     Physical Exam Vitals and nursing note reviewed.  Constitutional:      General: She is not in acute distress.    Appearance: Normal appearance. She is not ill-appearing.  HENT:     Head: Normocephalic and atraumatic.      Mouth/Throat:     Lips: Pink.     Mouth: Mucous membranes are moist.     Comments: Small aphthous ulcers to inner upper and lower lips.  No swelling, vesicles or lesions. Eyes:     Pupils: Pupils are equal, round, and reactive to light.  Cardiovascular:     Rate and Rhythm: Normal rate.  Pulmonary:     Effort: Pulmonary effort is normal.  Skin:    General: Skin is warm and dry.  Neurological:     General: No focal deficit present.     Mental Status: She is alert and oriented to person, place, and time.  Psychiatric:        Mood and Affect: Mood normal.        Behavior: Behavior normal.      UC Treatments / Results  Labs (all labs ordered are listed, but only abnormal results are displayed) Labs Reviewed  RPR  HIV ANTIBODY (ROUTINE TESTING W REFLEX)  CERVICOVAGINAL ANCILLARY ONLY  CYTOLOGY, (ORAL, ANAL, URETHRAL) ANCILLARY ONLY    EKG   Radiology No results found.  Procedures Procedures (including critical care time)  Medications Ordered in UC Medications - No data to display  Initial Impression / Assessment and Plan / UC Course  I have reviewed the triage vital signs and the nursing notes.  Pertinent labs & imaging results that were available during my care of the patient were reviewed by me and considered in my medical decision making (see chart for details).     Reviewed exam and symptoms with patient.  No red flags.  Discussed canker sores likely secondary to her braces.  Advised to use the wax provided by the orthodontist to cover the braces to prevent further injury.  STD testing is ordered and will contact for any positive results.  Follow-up with PCP if symptoms do not improve.  ER precautions reviewed and patient verbalized understanding. Final Clinical Impressions(s) / UC Diagnoses   Final diagnoses:  Screening  examination for STD (sexually transmitted disease)  Aphthous ulcer     Discharge Instructions      The clinic will contact you with  results of the STD testing done today if positive.  I would use the braces wax to help protect your inner lips from any additional injury.  Follow-up with your PCP as needed.    ED Prescriptions   None    PDMP not reviewed this encounter.   Radford Pax, NP 09/03/22 [phone]

## 2022-09-05 LAB — SYPHILIS: RPR W/REFLEX TO RPR TITER AND TREPONEMAL ANTIBODIES, TRADITIONAL SCREENING AND DIAGNOSIS ALGORITHM: RPR Ser Ql: NONREACTIVE

## 2022-09-05 LAB — HIV ANTIBODY (ROUTINE TESTING W REFLEX): HIV Screen 4th Generation wRfx: NONREACTIVE

## 2022-09-11 LAB — CERVICOVAGINAL ANCILLARY ONLY
Chlamydia: NEGATIVE
Comment: NEGATIVE
Comment: NEGATIVE
Comment: NORMAL
Neisseria Gonorrhea: NEGATIVE
Trichomonas: NEGATIVE

## 2022-09-11 LAB — CYTOLOGY, (ORAL, ANAL, URETHRAL) ANCILLARY ONLY
Chlamydia: NEGATIVE
Comment: NEGATIVE
Comment: NORMAL
Neisseria Gonorrhea: NEGATIVE

## 2022-09-12 ENCOUNTER — Other Ambulatory Visit (HOSPITAL_COMMUNITY): Payer: Self-pay | Admitting: Family Medicine

## 2022-09-12 DIAGNOSIS — R591 Generalized enlarged lymph nodes: Secondary | ICD-10-CM

## 2022-09-15 ENCOUNTER — Other Ambulatory Visit: Payer: Self-pay | Admitting: Family Medicine

## 2022-09-15 DIAGNOSIS — R591 Generalized enlarged lymph nodes: Secondary | ICD-10-CM

## 2022-09-18 ENCOUNTER — Ambulatory Visit
Admission: RE | Admit: 2022-09-18 | Discharge: 2022-09-18 | Disposition: A | Discharge status: Home / Self Care | Payer: Medicaid Other | Source: Ambulatory Visit | Attending: Family Medicine | Admitting: Family Medicine

## 2022-09-18 ENCOUNTER — Other Ambulatory Visit: Payer: Self-pay | Admitting: Family Medicine

## 2022-09-18 ENCOUNTER — Ambulatory Visit (HOSPITAL_COMMUNITY)
Admission: RE | Admit: 2022-09-18 | Discharge: 2022-09-18 | Disposition: A | Discharge status: Home / Self Care | Payer: Medicaid Other | Source: Ambulatory Visit | Attending: Family Medicine | Admitting: Family Medicine

## 2022-09-18 DIAGNOSIS — R92331 Mammographic heterogeneous density, right breast: Secondary | ICD-10-CM | POA: Diagnosis not present

## 2022-09-18 DIAGNOSIS — R591 Generalized enlarged lymph nodes: Secondary | ICD-10-CM | POA: Insufficient documentation

## 2022-09-18 DIAGNOSIS — R590 Localized enlarged lymph nodes: Secondary | ICD-10-CM | POA: Diagnosis not present

## 2022-09-18 DIAGNOSIS — J9811 Atelectasis: Secondary | ICD-10-CM | POA: Diagnosis not present

## 2022-09-18 DIAGNOSIS — E042 Nontoxic multinodular goiter: Secondary | ICD-10-CM | POA: Diagnosis not present

## 2022-09-18 MED ORDER — IOHEXOL 350 MG/ML SOLN
50.0000 mL | Freq: Once | INTRAVENOUS | Status: AC | PRN
  Administered 2022-09-18: 50 mL via INTRAVENOUS

## 2022-09-19 ENCOUNTER — Other Ambulatory Visit: Payer: Self-pay | Admitting: Family Medicine

## 2022-09-19 DIAGNOSIS — R599 Enlarged lymph nodes, unspecified: Secondary | ICD-10-CM

## 2022-09-23 ENCOUNTER — Ambulatory Visit
Admission: RE | Admit: 2022-09-23 | Discharge: 2022-09-23 | Disposition: A | Discharge status: Home / Self Care | Payer: Medicaid Other | Source: Ambulatory Visit | Attending: Family Medicine | Admitting: Family Medicine

## 2022-09-23 ENCOUNTER — Ambulatory Visit
Admission: RE | Admit: 2022-09-23 | Discharge: 2022-09-23 | Disposition: A | Discharge status: Home / Self Care | Payer: Medicaid Other | Source: Ambulatory Visit | Attending: Family Medicine

## 2022-09-23 DIAGNOSIS — R599 Enlarged lymph nodes, unspecified: Secondary | ICD-10-CM

## 2022-09-23 DIAGNOSIS — R590 Localized enlarged lymph nodes: Secondary | ICD-10-CM | POA: Diagnosis not present

## 2022-10-27 DIAGNOSIS — N898 Other specified noninflammatory disorders of vagina: Secondary | ICD-10-CM | POA: Diagnosis not present

## 2022-10-27 DIAGNOSIS — F419 Anxiety disorder, unspecified: Secondary | ICD-10-CM | POA: Diagnosis not present

## 2022-10-27 DIAGNOSIS — Z113 Encounter for screening for infections with a predominantly sexual mode of transmission: Secondary | ICD-10-CM | POA: Diagnosis not present

## 2022-11-05 DIAGNOSIS — H5213 Myopia, bilateral: Secondary | ICD-10-CM | POA: Diagnosis not present

## 2022-12-01 ENCOUNTER — Other Ambulatory Visit: Payer: Self-pay | Admitting: Family Medicine

## 2022-12-01 DIAGNOSIS — K118 Other diseases of salivary glands: Secondary | ICD-10-CM | POA: Diagnosis not present

## 2022-12-01 DIAGNOSIS — Z113 Encounter for screening for infections with a predominantly sexual mode of transmission: Secondary | ICD-10-CM | POA: Diagnosis not present

## 2022-12-01 DIAGNOSIS — E041 Nontoxic single thyroid nodule: Secondary | ICD-10-CM

## 2022-12-01 DIAGNOSIS — Z6831 Body mass index (BMI) 31.0-31.9, adult: Secondary | ICD-10-CM | POA: Diagnosis not present

## 2022-12-01 DIAGNOSIS — Z0000 Encounter for general adult medical examination without abnormal findings: Secondary | ICD-10-CM | POA: Diagnosis not present

## 2022-12-01 DIAGNOSIS — Z13220 Encounter for screening for lipoid disorders: Secondary | ICD-10-CM | POA: Diagnosis not present

## 2022-12-01 DIAGNOSIS — Z1211 Encounter for screening for malignant neoplasm of colon: Secondary | ICD-10-CM | POA: Diagnosis not present

## 2022-12-01 DIAGNOSIS — Z1159 Encounter for screening for other viral diseases: Secondary | ICD-10-CM | POA: Diagnosis not present

## 2022-12-01 DIAGNOSIS — I10 Essential (primary) hypertension: Secondary | ICD-10-CM | POA: Diagnosis not present

## 2022-12-01 DIAGNOSIS — R7303 Prediabetes: Secondary | ICD-10-CM | POA: Diagnosis not present

## 2022-12-01 DIAGNOSIS — E66811 Obesity, class 1: Secondary | ICD-10-CM | POA: Diagnosis not present

## 2022-12-09 ENCOUNTER — Ambulatory Visit
Admission: EM | Admit: 2022-12-09 | Discharge: 2022-12-09 | Disposition: A | Discharge status: Home / Self Care | Payer: Medicaid Other | Attending: Family Medicine | Admitting: Family Medicine

## 2022-12-09 DIAGNOSIS — B9689 Other specified bacterial agents as the cause of diseases classified elsewhere: Secondary | ICD-10-CM | POA: Insufficient documentation

## 2022-12-09 DIAGNOSIS — N760 Acute vaginitis: Secondary | ICD-10-CM | POA: Diagnosis not present

## 2022-12-09 MED ORDER — FLUCONAZOLE 150 MG PO TABS: 150.0000 mg | ORAL_TABLET | ORAL | 0 refills | Status: DC

## 2022-12-09 MED ORDER — METRONIDAZOLE 500 MG PO TABS: 500.0000 mg | ORAL_TABLET | Freq: Two times a day (BID) | ORAL | 0 refills | Status: DC

## 2022-12-09 NOTE — ED Triage Notes (Signed)
Pt c/o vaginal d/c x 2 days-NAD-steady gait 

## 2022-12-09 NOTE — ED Provider Notes (Signed)
[name]  Note:  This document was prepared using Conservation officer, historic buildings and may include unintentional dictation errors.  MRN: [id number] DOB: [birth year]  Subjective:   Carmen Lewis is a 47 y.o. female presenting for 2-day history of vaginal discharge and irritation.  Patient has had recurrent bacterial vaginosis since having an IUD placed.  Would like to be covered for this.  Would like STI testing as well just to make sure.  No urinary symptoms.  No current facility-administered medications for this encounter.  Current Outpatient Medications:    metoprolol tartrate (LOPRESSOR) 25 MG tablet, Take 1 tablet (25 mg total) by mouth 2 (two) times daily., Disp: 60 tablet, Rfl: 3   olmesartan (BENICAR) 20 MG tablet, Take 10 mg by mouth daily., Disp: , Rfl:    sertraline (ZOLOFT) 25 MG tablet, Take 25 mg by mouth daily., Disp: , Rfl:    SUMAtriptan (IMITREX) 50 MG tablet, Take 50 mg by mouth every 2 (two) hours as needed., Disp: , Rfl:    Allergies  Allergen Reactions   Lisinopril     HA   Latex Itching    Past Medical History:  Diagnosis Date   Contraceptive management    DVT (deep venous thrombosis) (HCC) 07/07/2010   right leg   Headache disorder    Hypertension    Osteoporosis    LEFT HIP     Past Surgical History:  Procedure Laterality Date   BREAST BIOPSY Left    NO PAST SURGERIES     TUBAL LIGATION Bilateral 11/25/2015   Procedure: POST PARTUM TUBAL LIGATION, Excision Left Paratubal Cyst;  Surgeon: Lesly Dukes, MD;  Location: [facility];  Service: Gynecology;  Laterality: Bilateral;    Family History  Problem Relation Age of Onset   Diabetes Mother    Hypertension Father    Hypertension Maternal Aunt    Breast cancer Maternal Aunt 40   Hypertension Paternal Uncle    Hypertension Maternal Grandmother    Hypertension Maternal Grandfather    Hypertension Paternal Grandmother     Social History   Tobacco Use   Smoking  status: Never   Smokeless tobacco: Never  Vaping Use   Vaping status: Never Used  Substance Use Topics   Alcohol use: No   Drug use: No    ROS   Objective:   Vitals: BP (!) 148/93 (BP Location: Right Arm)   Pulse (!) 55   Temp 98.1 F (36.7 C) (Oral)   Resp 16   SpO2 94%   Physical Exam Constitutional:      General: She is not in acute distress.    Appearance: Normal appearance. She is well-developed. She is not ill-appearing, toxic-appearing or diaphoretic.  HENT:     Head: Normocephalic and atraumatic.     Nose: Nose normal.     Mouth/Throat:     Mouth: Mucous membranes are moist.     Pharynx: Oropharynx is clear.  Eyes:     General: No scleral icterus.       Right eye: No discharge.        Left eye: No discharge.     Extraocular Movements: Extraocular movements intact.     Conjunctiva/sclera: Conjunctivae normal.  Cardiovascular:     Rate and Rhythm: Normal rate.  Pulmonary:     Effort: Pulmonary effort is normal.  Abdominal:     General: Bowel sounds are normal. There is no distension.     Palpations: Abdomen is soft.  There is no mass.     Tenderness: There is no abdominal tenderness. There is no right CVA tenderness, left CVA tenderness, guarding or rebound.  Skin:    General: Skin is warm and dry.  Neurological:     General: No focal deficit present.     Mental Status: She is alert and oriented to person, place, and time.  Psychiatric:        Mood and Affect: Mood normal.        Behavior: Behavior normal.        Thought Content: Thought content normal.        Judgment: Judgment normal.     Assessment and Plan :   PDMP not reviewed this encounter.  1. Bacterial vaginosis   2. Acute vaginitis    We will treat patient empirically for bacterial vaginosis with Flagyl and for yeast vaginitis with fluconazole.  Labs pending. Counseled patient on potential for adverse effects with medications prescribed/recommended today, ER and return-to-clinic  precautions discussed, patient verbalized understanding.    Wallis Bamberg, [place] 12/09/22 1616

## 2022-12-10 LAB — CERVICOVAGINAL ANCILLARY ONLY
Bacterial Vaginitis (gardnerella): POSITIVE — AB
Candida Glabrata: NEGATIVE
Candida Vaginitis: POSITIVE — AB
Chlamydia: NEGATIVE
Comment: NEGATIVE
Comment: NEGATIVE
Comment: NEGATIVE
Comment: NEGATIVE
Comment: NEGATIVE
Comment: NORMAL
Neisseria Gonorrhea: NEGATIVE
Trichomonas: NEGATIVE

## 2023-02-19 ENCOUNTER — Ambulatory Visit
Admission: EM | Admit: 2023-02-19 | Discharge: 2023-02-19 | Disposition: A | Discharge status: Home / Self Care | Payer: Medicaid Other | Attending: Family Medicine | Admitting: Family Medicine

## 2023-02-19 DIAGNOSIS — N898 Other specified noninflammatory disorders of vagina: Secondary | ICD-10-CM | POA: Diagnosis not present

## 2023-02-19 MED ORDER — METRONIDAZOLE 500 MG PO TABS
500.0000 mg | ORAL_TABLET | Freq: Two times a day (BID) | ORAL | 0 refills | Status: DC
Start: 2023-02-19 — End: 2023-08-04

## 2023-02-19 NOTE — ED Provider Notes (Signed)
[facility]    CSN: [id number] Arrival date & time: 02/19/23  1754      History   Chief Complaint No chief complaint on file.   HPI Carmen Lewis is a 48 y.o. female presents for vaginal discharge.  Patient reports 5 days of a watery nonmalodorous nonpruritic vaginal discharge.  Denies any dysuria, fevers, nausea/vomiting, flank pain.  No STD exposure but would like screening.  Does have an IUD and states she has had recurrent BV infections.  She states this feels similar.  No OTC medications have been used since onset.  No other concerns at this time.  HPI  Past Medical History:  Diagnosis Date   Contraceptive management    DVT (deep venous thrombosis) (HCC) 07/07/2010   right leg   Headache disorder    Hypertension    Osteoporosis    LEFT HIP    Patient Active Problem List   Diagnosis Date Noted   Essential hypertension 07/18/2022   Palpitations 07/17/2022   Snoring 07/21/2018   Witnessed episode of apnea 07/21/2018   Excessive sleepiness 07/21/2018   History of DVT (deep vein thrombosis) 08/21/2015    Past Surgical History:  Procedure Laterality Date   BREAST BIOPSY Left    NO PAST SURGERIES     TUBAL LIGATION Bilateral 11/25/2015   Procedure: POST PARTUM TUBAL LIGATION, Excision Left Paratubal Cyst;  Surgeon: Lesly Dukes, MD;  Location: [facility];  Service: Gynecology;  Laterality: Bilateral;    OB History     Gravida  5   Para  3   Term  3   Preterm      AB  2   Living  3      SAB  1   IAB  1   Ectopic      Multiple  0   Live Births  3            Home Medications    Prior to Admission medications   Medication Sig Start Date End Date Taking? Authorizing Provider  metroNIDAZOLE (FLAGYL) 500 MG tablet Take 1 tablet (500 mg total) by mouth 2 (two) times daily. 02/19/23  Yes Radford Pax, NP  fluconazole (DIFLUCAN) 150 MG tablet Take 1 tablet (150 mg total) by mouth once a week. 12/09/22   Wallis Bamberg, PA-C  metoprolol  tartrate (LOPRESSOR) 25 MG tablet Take 1 tablet (25 mg total) by mouth 2 (two) times daily. 07/18/22 10/16/22  Patwardhan, Anabel Bene, MD  olmesartan (BENICAR) 20 MG tablet Take 10 mg by mouth daily.    [provider]  sertraline (ZOLOFT) 25 MG tablet Take 25 mg by mouth daily.    [provider]  SUMAtriptan (IMITREX) 50 MG tablet Take 50 mg by mouth every 2 (two) hours as needed. 06/25/18   [provider]    Family History Family History  Problem Relation Age of Onset   Diabetes Mother    Hypertension Father    Hypertension Maternal Aunt    Breast cancer Maternal Aunt 20   Hypertension Paternal Uncle    Hypertension Maternal Grandmother    Hypertension Maternal Grandfather    Hypertension Paternal Grandmother     Social History Social History   Tobacco Use   Smoking status: Never   Smokeless tobacco: Never  Vaping Use   Vaping status: Never Used  Substance Use Topics   Alcohol use: No   Drug use: No     Allergies   Lisinopril and Latex  Review of Systems Review of Systems  Genitourinary:  Positive for vaginal discharge.     Physical Exam Triage Vital Signs ED Triage Vitals  Encounter Vitals Group     BP 02/19/23 1803 (!) 142/94     Systolic BP Percentile --      Diastolic BP Percentile --      Pulse Rate 02/19/23 1803 70     Resp 02/19/23 1803 16     Temp 02/19/23 1803 98.9 F (37.2 C)     Temp Source 02/19/23 1803 Oral     SpO2 02/19/23 1803 96 %     Weight --      Height --      Head Circumference --      Peak Flow --      Pain Score 02/19/23 1800 0     Pain Loc --      Pain Education --      Exclude from Growth Chart --    No data found.  Updated Vital Signs BP (!) 142/94 (BP Location: Right Arm)   Pulse 70   Temp 98.9 F (37.2 C) (Oral)   Resp 16   SpO2 96%   Visual Acuity Right Eye Distance:   Left Eye Distance:   Bilateral Distance:    Right Eye Near:   Left Eye Near:    Bilateral Near:     Physical  Exam Vitals and nursing note reviewed.  Constitutional:      General: She is not in acute distress.    Appearance: Normal appearance. She is not ill-appearing.  HENT:     Head: Normocephalic and atraumatic.  Eyes:     Pupils: Pupils are equal, round, and reactive to light.  Cardiovascular:     Rate and Rhythm: Normal rate.  Pulmonary:     Effort: Pulmonary effort is normal.  Abdominal:     Tenderness: There is no right CVA tenderness or left CVA tenderness.  Skin:    General: Skin is warm and dry.  Neurological:     General: No focal deficit present.     Mental Status: She is alert and oriented to person, place, and time.  Psychiatric:        Mood and Affect: Mood normal.        Behavior: Behavior normal.      UC Treatments / Results  Labs (all labs ordered are listed, but only abnormal results are displayed) Labs Reviewed  CERVICOVAGINAL ANCILLARY ONLY    EKG   Radiology No results found.  Procedures Procedures (including critical care time)  Medications Ordered in UC Medications - No data to display  Initial Impression / Assessment and Plan / UC Course  I have reviewed the triage vital signs and the nursing notes.  Pertinent labs & imaging results that were available during my care of the patient were reviewed by me and considered in my medical decision making (see chart for details).     Reviewed exam and symptoms with patient.  No red flags.  Vaginal swab was ordered we will contact for any positive results.  As patient feels symptoms are consistent with previous BV infections will start Flagyl.  Advise GYN or PCP follow-up as symptoms do not improve.  ER precautions reviewed. Final Clinical Impressions(s) / UC Diagnoses   Final diagnoses:  Vaginal discharge     Discharge Instructions      The clinic will contact you for any positive results based of the vaginal swab done today.  You may start Flagyl twice daily for 7 days.  Please follow-up with  your gynecologist or PCP if your symptoms do not improve.  Please go to the ER for any worsening symptoms.  Hope you feel better soon!     ED Prescriptions     Medication Sig Dispense Auth. Provider   metroNIDAZOLE (FLAGYL) 500 MG tablet Take 1 tablet (500 mg total) by mouth 2 (two) times daily. 14 tablet Radford Pax, NP      PDMP not reviewed this encounter.   Radford Pax, NP 02/19/23 1820

## 2023-02-19 NOTE — Discharge Instructions (Addendum)
The clinic will contact you for any positive results based of the vaginal swab done today.  You may start Flagyl twice daily for 7 days.  Please follow-up with your gynecologist or PCP if your symptoms do not improve.  Please go to the ER for any worsening symptoms.  Hope you feel better soon!

## 2023-02-19 NOTE — ED Triage Notes (Signed)
Pt presents to UC for c/o watery vaginal discharge x5 days. Recurrent BV Pt states she recently switched soaps.

## 2023-02-20 LAB — CERVICOVAGINAL ANCILLARY ONLY
Bacterial Vaginitis (gardnerella): POSITIVE — AB
Candida Glabrata: NEGATIVE
Candida Vaginitis: NEGATIVE
Chlamydia: NEGATIVE
Comment: NEGATIVE
Comment: NEGATIVE
Comment: NEGATIVE
Comment: NEGATIVE
Comment: NEGATIVE
Comment: NORMAL
Neisseria Gonorrhea: NEGATIVE
Trichomonas: NEGATIVE

## 2023-05-22 ENCOUNTER — Other Ambulatory Visit: Payer: Self-pay | Admitting: Obstetrics and Gynecology

## 2023-05-22 DIAGNOSIS — Z1231 Encounter for screening mammogram for malignant neoplasm of breast: Secondary | ICD-10-CM

## 2023-05-25 DIAGNOSIS — N898 Other specified noninflammatory disorders of vagina: Secondary | ICD-10-CM | POA: Diagnosis not present

## 2023-05-25 DIAGNOSIS — R7303 Prediabetes: Secondary | ICD-10-CM | POA: Diagnosis not present

## 2023-05-25 DIAGNOSIS — M25511 Pain in right shoulder: Secondary | ICD-10-CM | POA: Diagnosis not present

## 2023-05-25 DIAGNOSIS — R350 Frequency of micturition: Secondary | ICD-10-CM | POA: Diagnosis not present

## 2023-06-14 DIAGNOSIS — N898 Other specified noninflammatory disorders of vagina: Secondary | ICD-10-CM | POA: Diagnosis not present

## 2023-07-09 DIAGNOSIS — R102 Pelvic and perineal pain: Secondary | ICD-10-CM | POA: Diagnosis not present

## 2023-07-09 DIAGNOSIS — Z113 Encounter for screening for infections with a predominantly sexual mode of transmission: Secondary | ICD-10-CM | POA: Diagnosis not present

## 2023-07-09 DIAGNOSIS — Z975 Presence of (intrauterine) contraceptive device: Secondary | ICD-10-CM | POA: Diagnosis not present

## 2023-07-09 DIAGNOSIS — R87610 Atypical squamous cells of undetermined significance on cytologic smear of cervix (ASC-US): Secondary | ICD-10-CM | POA: Diagnosis not present

## 2023-07-09 DIAGNOSIS — Z01419 Encounter for gynecological examination (general) (routine) without abnormal findings: Secondary | ICD-10-CM | POA: Diagnosis not present

## 2023-07-09 DIAGNOSIS — Z124 Encounter for screening for malignant neoplasm of cervix: Secondary | ICD-10-CM | POA: Diagnosis not present

## 2023-07-09 DIAGNOSIS — Z0000 Encounter for general adult medical examination without abnormal findings: Secondary | ICD-10-CM | POA: Diagnosis not present

## 2023-07-28 ENCOUNTER — Ambulatory Visit
Admission: RE | Admit: 2023-07-28 | Discharge: 2023-07-28 | Disposition: A | Discharge status: Home / Self Care | Source: Ambulatory Visit | Attending: Obstetrics and Gynecology | Admitting: Obstetrics and Gynecology

## 2023-07-28 DIAGNOSIS — Z1231 Encounter for screening mammogram for malignant neoplasm of breast: Secondary | ICD-10-CM | POA: Diagnosis not present

## 2023-08-04 ENCOUNTER — Ambulatory Visit
Admission: EM | Admit: 2023-08-04 | Discharge: 2023-08-04 | Disposition: A | Discharge status: Home / Self Care | Attending: Family Medicine | Admitting: Family Medicine

## 2023-08-04 DIAGNOSIS — N760 Acute vaginitis: Secondary | ICD-10-CM

## 2023-08-04 DIAGNOSIS — F419 Anxiety disorder, unspecified: Secondary | ICD-10-CM | POA: Diagnosis not present

## 2023-08-04 DIAGNOSIS — N898 Other specified noninflammatory disorders of vagina: Secondary | ICD-10-CM | POA: Insufficient documentation

## 2023-08-04 DIAGNOSIS — R102 Pelvic and perineal pain: Secondary | ICD-10-CM

## 2023-08-04 DIAGNOSIS — B9689 Other specified bacterial agents as the cause of diseases classified elsewhere: Secondary | ICD-10-CM | POA: Diagnosis not present

## 2023-08-04 LAB — POCT URINE DIPSTICK
Bilirubin, UA: NEGATIVE
Glucose, UA: NEGATIVE mg/dL
Ketones, POC UA: NEGATIVE mg/dL
Leukocytes, UA: NEGATIVE
Nitrite, UA: NEGATIVE
POC PROTEIN,UA: 100 — AB
Spec Grav, UA: 1.02 (ref 1.010–1.025)
Urobilinogen, UA: 0.2 U/dL
pH, UA: 6 (ref 5.0–8.0)

## 2023-08-04 LAB — POCT URINE PREGNANCY: Preg Test, Ur: NEGATIVE

## 2023-08-04 MED ORDER — METRONIDAZOLE 500 MG PO TABS: 500.0000 mg | ORAL_TABLET | Freq: Two times a day (BID) | ORAL | 0 refills | Status: DC

## 2023-08-04 MED ORDER — HYDROXYZINE HCL 25 MG PO TABS: 12.5000 mg | ORAL_TABLET | Freq: Three times a day (TID) | ORAL | 0 refills | Status: AC | PRN

## 2023-08-04 NOTE — ED Triage Notes (Signed)
 Pt c/o RUQ pain x 2 days. Patient states she had her first period after not having one for two years.  Patient denies any vomiting or diarrhea. Patient is upset and would like to discuss some anxiety she is feeling.

## 2023-08-04 NOTE — ED Provider Notes (Signed)
 [name]  Note:  This document was prepared using Conservation officer, historic buildings and may include unintentional dictation errors.  MRN: [id number] DOB: 1975/07/31  Subjective:   Carmen Lewis is a 48 y.o. female presenting for 2 day history of RLQ pain/pelvic pain. Has had vaginal discharge, had a cycle in the past week after having no cycles for 2 years. Believes she's had her IUD in place for 3 years. Denies fever, n/v, abdominal pain, rashes, dysuria, urinary frequency, hematuria.  Has a history of tubal ligation. No other pelvic surgeries. Has a history of BV and feels the same to the patient, would like empiric treatment. Would also like to have something for anxiety. Has significant stressors. Just lost her relationship, her job, moved to a new place. 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 This caused difficulty with her work due to needing to help treat her daughter.  She has previously taken antianxiety medication, specifically fluoxetine.  Unfortunately she had to stop this medication due to side effects and never restarted a different medication.  She does have a PCP, Dr. Rolinda, but has not set up an appointment for management of her anxiety.  Her next appointment for general follow-up is in November of this year.  No current facility-administered medications for this encounter.  Current Outpatient Medications:    fluconazole  (DIFLUCAN ) 150 MG tablet, Take 1 tablet (150 mg total) by mouth once a week., Disp: 2 tablet, Rfl: 0   metoprolol  tartrate (LOPRESSOR ) 25 MG tablet, Take 1 tablet (25 mg total) by mouth 2 (two) times daily., Disp: 60 tablet, Rfl: 3   metroNIDAZOLE  (FLAGYL ) 500 MG tablet, Take 1 tablet (500 mg total) by mouth 2 (two) times daily., Disp: 14 tablet, Rfl: 0   olmesartan (BENICAR) 20 MG tablet, Take 10 mg by mouth daily., Disp: , Rfl:    sertraline (ZOLOFT) 25 MG tablet, Take 25 mg by mouth daily.,  Disp: , Rfl:    SUMAtriptan  (IMITREX ) 50 MG tablet, Take 50 mg by mouth every 2 (two) hours as needed., Disp: , Rfl:    Allergies  Allergen Reactions   Lisinopril      HA   Latex Itching    Past Medical History:  Diagnosis Date   Contraceptive management    DVT (deep venous thrombosis) (HCC) 07/07/2010   right leg   Headache disorder    Hypertension    Osteoporosis    LEFT HIP     Past Surgical History:  Procedure Laterality Date   BREAST BIOPSY Left    NO PAST SURGERIES     TUBAL LIGATION Bilateral 11/25/2015   Procedure: POST PARTUM TUBAL LIGATION, Excision Left Paratubal Cyst;  Surgeon: Burnard VEAR Pate, MD;  Location: [facility];  Service: Gynecology;  Laterality: Bilateral;    Family History  Problem Relation Age of Onset   Diabetes Mother    Hypertension Father    Hypertension Maternal Aunt    Breast cancer Maternal Aunt 23   Hypertension Paternal Uncle    Hypertension Maternal Grandmother    Hypertension Maternal Grandfather    Hypertension Paternal Grandmother     Social History   Tobacco Use   Smoking status: Never   Smokeless tobacco: Never  Vaping Use   Vaping status: Never Used  Substance Use Topics   Alcohol use: No   Drug use: No    ROS   Objective:   Vitals: BP ROLLEN)  149/93 (BP Location: Left Arm)   Pulse 77   Temp 98.5 F (36.9 C) (Oral)   Resp 20   LMP 07/13/2023   SpO2 98%   Physical Exam Constitutional:      General: She is not in acute distress.    Appearance: Normal appearance. She is well-developed. She is not ill-appearing, toxic-appearing or diaphoretic.  HENT:     Head: Normocephalic and atraumatic.     Nose: Nose normal.     Mouth/Throat:     Mouth: Mucous membranes are moist.  Eyes:     General: No scleral icterus.       Right eye: No discharge.        Left eye: No discharge.     Extraocular Movements: Extraocular movements intact.     Conjunctiva/sclera: Conjunctivae normal.  Cardiovascular:     Rate and Rhythm:  Normal rate.  Pulmonary:     Effort: Pulmonary effort is normal.  Abdominal:     General: Bowel sounds are normal. There is no distension.     Palpations: Abdomen is soft. There is no mass.     Tenderness: There is no abdominal tenderness. There is no right CVA tenderness, left CVA tenderness, guarding or rebound.  Skin:    General: Skin is warm and dry.  Neurological:     General: No focal deficit present.     Mental Status: She is alert and oriented to person, place, and time.  Psychiatric:        Attention and Perception: Attention and perception normal. She is attentive. She does not perceive auditory or visual hallucinations.        Mood and Affect: Mood is anxious. Mood is not depressed or elated. Affect is tearful. Affect is not labile, blunt, flat, angry or inappropriate.        Speech: She is communicative. Speech is not rapid and pressured, delayed, slurred or tangential.        Behavior: Behavior normal. Behavior is not agitated, slowed, aggressive, withdrawn, hyperactive or combative.        Thought Content: Thought content normal. Thought content is not paranoid or delusional. Thought content does not include homicidal or suicidal ideation. Thought content does not include homicidal or suicidal plan.        Cognition and Memory: Cognition is not impaired. Memory is not impaired. She does not exhibit impaired recent memory or impaired remote memory.        Judgment: Judgment normal. Judgment is not impulsive or inappropriate.    Results for orders placed or performed during the hospital encounter of 08/04/23 (from the past 24 hours)  POCT URINE DIPSTICK     Status: Abnormal   Collection Time: 08/04/23 10:18 AM  Result Value Ref Range   Color, UA yellow yellow   Clarity, UA hazy (A) clear   Glucose, UA negative negative mg/dL   Bilirubin, UA negative negative   Ketones, POC UA negative negative mg/dL   Spec Grav, UA 8.979 8.989 - 1.025   Blood, UA small (A) negative   pH,  UA 6.0 5.0 - 8.0   POC PROTEIN,UA =100 (A) negative, trace   Urobilinogen, UA 0.2 0.2 or 1.0 E.U./dL   Nitrite, UA Negative Negative   Leukocytes, UA Negative Negative  POCT urine pregnancy     Status: None   Collection Time: 08/04/23 10:18 AM  Result Value Ref Range   Preg Test, Ur Negative Negative   Assessment and Plan :   PDMP not  reviewed this encounter.  1. Bacterial vaginosis   2. Pelvic pain in female   3. Vaginal discharge   4. Anxiety    Will treat empirically at the patient's request for recurrent bacterial vaginosis with metronidazole .  Low suspicion for an acute gynecologic emergency given physical exam findings, hemodynamically stable vital signs.  Labs pending, will follow-up tomorrow for this.  Patient understandably has significant life stressors and offered hydroxyzine  for acute anxiety episodes.  Patient would benefit from consultation with a behavioral health clinic and therefore I placed a referral for long-term management of her anxiety.  Also recommend she follow-up with her PCP as soon as possible for a similar consult.  Counseled patient on potential for adverse effects with medications prescribed/recommended today, ER and return-to-clinic precautions discussed, patient verbalized understanding.    Christopher Savannah, PA-C 08/04/23 1046

## 2023-08-05 ENCOUNTER — Other Ambulatory Visit: Payer: Self-pay | Admitting: Family Medicine

## 2023-08-05 ENCOUNTER — Other Ambulatory Visit: Payer: Self-pay | Admitting: Obstetrics and Gynecology

## 2023-08-05 DIAGNOSIS — R928 Other abnormal and inconclusive findings on diagnostic imaging of breast: Secondary | ICD-10-CM

## 2023-08-05 LAB — CERVICOVAGINAL ANCILLARY ONLY
Bacterial Vaginitis (gardnerella): POSITIVE — AB
Candida Glabrata: NEGATIVE
Candida Vaginitis: NEGATIVE
Chlamydia: NEGATIVE
Comment: NEGATIVE
Comment: NEGATIVE
Comment: NEGATIVE
Comment: NEGATIVE
Comment: NEGATIVE
Comment: NORMAL
Neisseria Gonorrhea: NEGATIVE
Trichomonas: NEGATIVE

## 2023-08-06 ENCOUNTER — Ambulatory Visit: Payer: Self-pay

## 2023-08-20 ENCOUNTER — Other Ambulatory Visit

## 2023-08-20 ENCOUNTER — Encounter

## 2023-08-27 ENCOUNTER — Other Ambulatory Visit

## 2023-08-27 ENCOUNTER — Ambulatory Visit
Admission: RE | Admit: 2023-08-27 | Discharge: 2023-08-27 | Disposition: A | Discharge status: Home / Self Care | Source: Ambulatory Visit | Attending: Obstetrics and Gynecology | Admitting: Obstetrics and Gynecology

## 2023-08-27 ENCOUNTER — Encounter

## 2023-08-27 ENCOUNTER — Ambulatory Visit

## 2023-08-27 DIAGNOSIS — N6489 Other specified disorders of breast: Secondary | ICD-10-CM | POA: Diagnosis not present

## 2023-08-27 DIAGNOSIS — R92333 Mammographic heterogeneous density, bilateral breasts: Secondary | ICD-10-CM | POA: Diagnosis not present

## 2023-08-27 DIAGNOSIS — N6001 Solitary cyst of right breast: Secondary | ICD-10-CM | POA: Diagnosis not present

## 2023-08-27 DIAGNOSIS — R928 Other abnormal and inconclusive findings on diagnostic imaging of breast: Secondary | ICD-10-CM | POA: Diagnosis not present

## 2023-09-30 ENCOUNTER — Ambulatory Visit
Admission: EM | Admit: 2023-09-30 | Discharge: 2023-09-30 | Disposition: A | Discharge status: Home / Self Care | Attending: Family Medicine | Admitting: Family Medicine

## 2023-09-30 DIAGNOSIS — N898 Other specified noninflammatory disorders of vagina: Secondary | ICD-10-CM | POA: Diagnosis not present

## 2023-09-30 DIAGNOSIS — R350 Frequency of micturition: Secondary | ICD-10-CM

## 2023-09-30 DIAGNOSIS — Z113 Encounter for screening for infections with a predominantly sexual mode of transmission: Secondary | ICD-10-CM

## 2023-09-30 LAB — POCT URINE DIPSTICK
Bilirubin, UA: NEGATIVE
Blood, UA: NEGATIVE
Glucose, UA: NEGATIVE mg/dL
Ketones, POC UA: NEGATIVE mg/dL
Nitrite, UA: NEGATIVE
POC PROTEIN,UA: NEGATIVE
Spec Grav, UA: 1.015 (ref 1.010–1.025)
Urobilinogen, UA: 1 U/dL
pH, UA: 7.5 (ref 5.0–8.0)

## 2023-09-30 LAB — POCT URINE PREGNANCY: Preg Test, Ur: NEGATIVE

## 2023-09-30 MED ORDER — FLUCONAZOLE 150 MG PO TABS: 150.0000 mg | ORAL_TABLET | Freq: Every day | ORAL | 0 refills | Status: AC

## 2023-09-30 NOTE — ED Triage Notes (Signed)
 Pt present with c/o abdominal cramping that occurs at the end of the month every month.  States she usually has cramps when she has a vaginal infection. Pt states she took flagyl  that was prescribed before. Pt states she has an IUD placed.

## 2023-09-30 NOTE — ED Provider Notes (Signed)
 [facility]    CSN: [id number] Arrival date & time: 09/30/23  1640      History   Chief Complaint Chief Complaint  Patient presents with   Abdominal Pain    HPI Carmen Lewis is a 48 y.o. female presents for vaginal discharge.  Patient reports that she has been having some lower abdominal cramping which she typically gets when she has a vaginal infection such as BV and yeast.  States has been going on for a couple of days.  Also reports about a month of urinary frequency without dysuria, hematuria, fevers, nausea/vomiting, flank pain.  No STD exposure but would like screening.  She does have an IUD.  States her current symptoms feel more like yeast.  She does have 4 leftover pills of metronidazole  and she took 2 of those today.  No other concerns at this time   Abdominal Pain Associated symptoms: vaginal discharge     Past Medical History:  Diagnosis Date   Contraceptive management    DVT (deep venous thrombosis) (HCC) 07/07/2010   right leg   Headache disorder    Hypertension    Osteoporosis    LEFT HIP    Patient Active Problem List   Diagnosis Date Noted   Essential hypertension 07/18/2022   Palpitations 07/17/2022   Snoring 07/21/2018   Witnessed episode of apnea 07/21/2018   Excessive sleepiness 07/21/2018   History of DVT (deep vein thrombosis) 08/21/2015    Past Surgical History:  Procedure Laterality Date   BREAST BIOPSY Left    NO PAST SURGERIES     TUBAL LIGATION Bilateral 11/25/2015   Procedure: POST PARTUM TUBAL LIGATION, Excision Left Paratubal Cyst;  Surgeon: Burnard VEAR Pate, MD;  Location: [facility];  Service: Gynecology;  Laterality: Bilateral;    OB History     Gravida  5   Para  3   Term  3   Preterm      AB  2   Living  3      SAB  1   IAB  1   Ectopic      Multiple  0   Live Births  3            Home Medications    Prior to Admission medications   Medication Sig Start Date End Date Taking? Authorizing  Provider  fluconazole  (DIFLUCAN ) 150 MG tablet Take 1 tablet (150 mg total) by mouth daily for 2 doses. Take 1 tablet today and you may repeat in 3 days if symptoms persist 09/30/23 10/02/23 Yes Loreda Myla SAUNDERS, NP  metroNIDAZOLE  (FLAGYL ) 500 MG tablet Take 1 tablet (500 mg total) by mouth 2 (two) times daily. 08/04/23  Yes Christopher Savannah, PA-C  hydrOXYzine  (ATARAX ) 25 MG tablet Take 0.5-1 tablets (12.5-25 mg total) by mouth every 8 (eight) hours as needed for anxiety. 08/04/23   Christopher Savannah, PA-C  metoprolol  tartrate (LOPRESSOR ) 25 MG tablet Take 1 tablet (25 mg total) by mouth 2 (two) times daily. 07/18/22 10/16/22  Patwardhan, Newman JINNY, MD  olmesartan (BENICAR) 20 MG tablet Take 10 mg by mouth daily.    [provider]  sertraline (ZOLOFT) 25 MG tablet Take 25 mg by mouth daily.    [provider]  SUMAtriptan  (IMITREX ) 50 MG tablet Take 50 mg by mouth every 2 (two) hours as needed. 06/25/18   [provider]    Family History Family History  Problem Relation Age of Onset   Diabetes Mother  Hypertension Father    Hypertension Maternal Aunt    Breast cancer Maternal Aunt 49   Hypertension Paternal Uncle    Hypertension Maternal Grandmother    Hypertension Maternal Grandfather    Hypertension Paternal Grandmother     Social History Social History   Tobacco Use   Smoking status: Never   Smokeless tobacco: Never  Vaping Use   Vaping status: Never Used  Substance Use Topics   Alcohol use: No   Drug use: No     Allergies   Lisinopril  and Latex   Review of Systems Review of Systems  Genitourinary:  Positive for frequency and vaginal discharge.     Physical Exam Triage Vital Signs ED Triage Vitals  Encounter Vitals Group     BP 09/30/23 1657 (!) 143/93     Girls Systolic BP Percentile --      Girls Diastolic BP Percentile --      Boys Systolic BP Percentile --      Boys Diastolic BP Percentile --      Pulse Rate 09/30/23 1657 71     Resp 09/30/23  1657 16     Temp 09/30/23 1657 99.2 F (37.3 C)     Temp Source 09/30/23 1657 Oral     SpO2 09/30/23 1657 96 %     Weight --      Height --      Head Circumference --      Peak Flow --      Pain Score 09/30/23 1656 2     Pain Loc --      Pain Education --      Exclude from Growth Chart --    No data found.  Updated Vital Signs BP (!) 143/93 (BP Location: Right Arm)   Pulse 71   Temp 99.2 F (37.3 C) (Oral)   Resp 16   LMP  (LMP Unknown)   SpO2 96%   Visual Acuity Right Eye Distance:   Left Eye Distance:   Bilateral Distance:    Right Eye Near:   Left Eye Near:    Bilateral Near:     Physical Exam Vitals and nursing note reviewed.  Constitutional:      Appearance: Normal appearance.  HENT:     Head: Normocephalic and atraumatic.  Eyes:     Pupils: Pupils are equal, round, and reactive to light.  Cardiovascular:     Rate and Rhythm: Normal rate.  Pulmonary:     Effort: Pulmonary effort is normal.  Skin:    General: Skin is warm and dry.  Neurological:     General: No focal deficit present.     Mental Status: She is alert and oriented to person, place, and time.  Psychiatric:        Mood and Affect: Mood normal.        Behavior: Behavior normal.      UC Treatments / Results  Labs (all labs ordered are listed, but only abnormal results are displayed) Labs Reviewed  POCT URINE DIPSTICK - Abnormal; Notable for the following components:      Result Value   Leukocytes, UA Trace (*)    All other components within normal limits  URINE CULTURE  POCT URINE PREGNANCY  CERVICOVAGINAL ANCILLARY ONLY    EKG   Radiology No results found.  Procedures Procedures (including critical care time)  Medications Ordered in UC Medications - No data to display  Initial Impression / Assessment and Plan / UC Course  I  have reviewed the triage vital signs and the nursing notes.  Pertinent labs & imaging results that were available during my care of the patient  were reviewed by me and considered in my medical decision making (see chart for details).     I reviewed exam and symptoms with patient.  UA shows trace leuks, will send urine culture.  Vaginal swab/STD testing is ordered and will contact for any positive results.  As patient reports symptoms are consistent with yeast we will start Diflucan  and additional treatment pending results.  Advise rest fluids and PCP or GYN follow-up if symptoms do not improve.  ER precautions reviewed Final Clinical Impressions(s) / UC Diagnoses   Final diagnoses:  Urinary frequency  Vaginal discharge  Screening examination for STD (sexually transmitted disease)     Discharge Instructions      The clinic will contact you with results of the STD testing/vaginal swab and urine culture done today if positive.  Start Diflucan  as prescribed.  Lots of rest and fluids.  Please follow-up with your PCP or gynecologist if your symptoms do not improve.  Please go to the ER for any worsening symptoms.  Hope you feel better soon!    ED Prescriptions     Medication Sig Dispense Auth. Provider   fluconazole  (DIFLUCAN ) 150 MG tablet Take 1 tablet (150 mg total) by mouth daily for 2 doses. Take 1 tablet today and you may repeat in 3 days if symptoms persist 2 tablet Tobin Cadiente, Jodi R, NP      PDMP not reviewed this encounter.   Loreda Myla SAUNDERS, NP 09/30/23 1740

## 2023-09-30 NOTE — Discharge Instructions (Signed)
 The clinic will contact you with results of the STD testing/vaginal swab and urine culture done today if positive.  Start Diflucan  as prescribed.  Lots of rest and fluids.  Please follow-up with your PCP or gynecologist if your symptoms do not improve.  Please go to the ER for any worsening symptoms.  Hope you feel better soon!

## 2023-10-01 ENCOUNTER — Ambulatory Visit (HOSPITAL_COMMUNITY): Payer: Self-pay

## 2023-10-01 LAB — CERVICOVAGINAL ANCILLARY ONLY
Bacterial Vaginitis (gardnerella): POSITIVE — AB
Candida Glabrata: NEGATIVE
Candida Vaginitis: POSITIVE — AB
Chlamydia: NEGATIVE
Comment: NEGATIVE
Comment: NEGATIVE
Comment: NEGATIVE
Comment: NEGATIVE
Comment: NEGATIVE
Comment: NORMAL
Neisseria Gonorrhea: NEGATIVE
Trichomonas: NEGATIVE

## 2023-10-01 LAB — URINE CULTURE: Culture: <10000 — AB

## 2023-10-01 MED ORDER — METRONIDAZOLE 500 MG PO TABS: 500.0000 mg | ORAL_TABLET | Freq: Two times a day (BID) | ORAL | 0 refills | Status: AC

## 2023-11-19 ENCOUNTER — Ambulatory Visit
Admission: EM | Admit: 2023-11-19 | Discharge: 2023-11-19 | Disposition: A | Discharge status: Home / Self Care | Attending: Student | Admitting: Student

## 2023-11-19 ENCOUNTER — Encounter: Payer: Self-pay | Admitting: Emergency Medicine

## 2023-11-19 DIAGNOSIS — S39012A Strain of muscle, fascia and tendon of lower back, initial encounter: Secondary | ICD-10-CM | POA: Diagnosis not present

## 2023-11-19 DIAGNOSIS — Z86718 Personal history of other venous thrombosis and embolism: Secondary | ICD-10-CM | POA: Insufficient documentation

## 2023-11-19 DIAGNOSIS — L918 Other hypertrophic disorders of the skin: Secondary | ICD-10-CM | POA: Insufficient documentation

## 2023-11-19 DIAGNOSIS — E669 Obesity, unspecified: Secondary | ICD-10-CM | POA: Insufficient documentation

## 2023-11-19 DIAGNOSIS — S90862A Insect bite (nonvenomous), left foot, initial encounter: Secondary | ICD-10-CM | POA: Insufficient documentation

## 2023-11-19 DIAGNOSIS — D485 Neoplasm of uncertain behavior of skin: Secondary | ICD-10-CM | POA: Insufficient documentation

## 2023-11-19 DIAGNOSIS — N926 Irregular menstruation, unspecified: Secondary | ICD-10-CM | POA: Insufficient documentation

## 2023-11-19 DIAGNOSIS — X58XXXA Exposure to other specified factors, initial encounter: Secondary | ICD-10-CM | POA: Diagnosis not present

## 2023-11-19 DIAGNOSIS — G43001 Migraine without aura, not intractable, with status migrainosus: Secondary | ICD-10-CM | POA: Insufficient documentation

## 2023-11-19 DIAGNOSIS — M545 Low back pain, unspecified: Secondary | ICD-10-CM | POA: Diagnosis present

## 2023-11-19 DIAGNOSIS — R7303 Prediabetes: Secondary | ICD-10-CM | POA: Insufficient documentation

## 2023-11-19 DIAGNOSIS — I10 Essential (primary) hypertension: Secondary | ICD-10-CM | POA: Insufficient documentation

## 2023-11-19 DIAGNOSIS — Z975 Presence of (intrauterine) contraceptive device: Secondary | ICD-10-CM | POA: Diagnosis not present

## 2023-11-19 DIAGNOSIS — F419 Anxiety disorder, unspecified: Secondary | ICD-10-CM | POA: Insufficient documentation

## 2023-11-19 DIAGNOSIS — G479 Sleep disorder, unspecified: Secondary | ICD-10-CM | POA: Insufficient documentation

## 2023-11-19 DIAGNOSIS — F5101 Primary insomnia: Secondary | ICD-10-CM | POA: Insufficient documentation

## 2023-11-19 DIAGNOSIS — F331 Major depressive disorder, recurrent, moderate: Secondary | ICD-10-CM | POA: Insufficient documentation

## 2023-11-19 DIAGNOSIS — G43109 Migraine with aura, not intractable, without status migrainosus: Secondary | ICD-10-CM | POA: Insufficient documentation

## 2023-11-19 LAB — POCT URINE DIPSTICK
Bilirubin, UA: NEGATIVE
Blood, UA: NEGATIVE
Glucose, UA: NEGATIVE mg/dL
Ketones, POC UA: NEGATIVE mg/dL
Nitrite, UA: NEGATIVE
POC PROTEIN,UA: NEGATIVE
Spec Grav, UA: 1.02 (ref 1.010–1.025)
Urobilinogen, UA: 0.2 U/dL
pH, UA: 7 (ref 5.0–8.0)

## 2023-11-19 MED ORDER — BACLOFEN 10 MG PO TABS: 10.0000 mg | ORAL_TABLET | Freq: Three times a day (TID) | ORAL | 0 refills | Status: AC

## 2023-11-19 MED ORDER — METHYLPREDNISOLONE SODIUM SUCC 125 MG IJ SOLR
60.0000 mg | Freq: Once | INTRAMUSCULAR | Status: AC
  Administered 2023-11-19: 60 mg via INTRAMUSCULAR

## 2023-11-19 NOTE — ED Provider Notes (Signed)
 [facility]    CSN: [id number] Arrival date & time: 11/19/23  1259      History   Chief Complaint Chief Complaint  Patient presents with   Back Pain    HPI Carmen Lewis is a 47 y.o. female presenting with back pain.  Pt c/o lower back pain for about 1 week. Pain is worse on right side. Worse with standing. Denies any other symptoms or known injury Has attempted ibuprofen  and tylenol  without much improvement. Denies hematuria, dysuria, frequency, urgency, abd pain, vaginal symptoms.  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 Denies gross hematuria.  HPI  Past Medical History:  Diagnosis Date   Contraceptive management    DVT (deep venous thrombosis) (HCC) 07/07/2010   right leg   Headache disorder    Hypertension    Osteoporosis    LEFT HIP    Patient Active Problem List   Diagnosis Date Noted   Major depressive disorder, recurrent, moderate (HCC) 11/19/2023   Irregular uterine bleeding 11/19/2023   Insect bite (nonvenomous), left foot, initial encounter (CODE) 11/19/2023   Anxiety 11/19/2023   Migraine without aura and with status migrainosus, not intractable 11/19/2023   Migraine aura without headache 11/19/2023   Neoplasm of uncertain behavior of skin 11/19/2023   Prediabetes 11/19/2023   Obesity 11/19/2023   Primary insomnia 11/19/2023   Skin tag 11/19/2023   Benign essential hypertension 11/19/2023   Sleep disturbance 11/19/2023   History of thromboembolism of vein 11/19/2023   Essential hypertension 07/18/2022   Palpitations 07/17/2022   Pain in finger of left hand 11/13/2021   Pain in joint of right shoulder 03/17/2019   Snoring 07/21/2018   Witnessed episode of apnea 07/21/2018   Excessive sleepiness 07/21/2018   Laryngopharyngeal reflux (LPR) 06/25/2018   Vestibular migraine 06/25/2018   Closed fracture of phalanx of foot 06/16/2018   Abnormal uterine bleeding 03/03/2017    Obesity (BMI 30-39.9) 01/28/2017   Hypertensive disorder 01/28/2017   History of DVT (deep vein thrombosis) 08/21/2015   History of deep venous thrombosis (DVT) of distal vein of right lower extremity 01/07/2012    Past Surgical History:  Procedure Laterality Date   BREAST BIOPSY Left    NO PAST SURGERIES     TUBAL LIGATION Bilateral 11/25/2015   Procedure: POST PARTUM TUBAL LIGATION, Excision Left Paratubal Cyst;  Surgeon: Burnard VEAR Pate, MD;  Location: [facility];  Service: Gynecology;  Laterality: Bilateral;    OB History     Gravida  5   Para  3   Term  3   Preterm      AB  2   Living  3      SAB  1   IAB  1   Ectopic      Multiple  0   Live Births  3            Home Medications    Prior to Admission medications   Medication Sig Start Date End Date Taking? Authorizing Provider  baclofen (LIORESAL) 10 MG tablet Take 1 tablet (10 mg total) by mouth 3 (three) times daily. 11/19/23  Yes Youssouf Shipley E, PA-C  fluconazole  (DIFLUCAN ) 150 MG tablet Take 150 mg by mouth once.    [provider]  hydrOXYzine  (ATARAX ) 25 MG tablet Take 0.5-1 tablets (12.5-25 mg total) by mouth every 8 (eight) hours as needed for anxiety. 08/04/23   Christopher Savannah, PA-C  metoprolol   tartrate (LOPRESSOR ) 25 MG tablet Take 1 tablet (25 mg total) by mouth 2 (two) times daily. 07/18/22 10/16/22  Patwardhan, Newman PARAS, MD  metoprolol  tartrate (LOPRESSOR ) 25 MG tablet Take 1 tablet by mouth 2 (two) times daily.    [provider]  metroNIDAZOLE  (FLAGYL ) 500 MG tablet Take 1 tablet (500 mg total) by mouth 2 (two) times daily. Patient not taking: Reported on 11/19/2023 08/04/23   Christopher Savannah, PA-C  olmesartan (BENICAR) 20 MG tablet Take 10 mg by mouth daily.    [provider]  sertraline (ZOLOFT) 25 MG tablet Take 25 mg by mouth daily.    [provider]  SUMAtriptan  (IMITREX ) 50 MG tablet Take 50 mg by mouth every 2 (two) hours as needed. 06/25/18   [provider]    Family History Family History  Problem Relation Age of Onset   Diabetes Mother    Hypertension Father    Hypertension Maternal Aunt    Breast cancer Maternal Aunt 71   Hypertension Paternal Uncle    Hypertension Maternal Grandmother    Hypertension Maternal Grandfather    Hypertension Paternal Grandmother     Social History Social History   Tobacco Use   Smoking status: Never   Smokeless tobacco: Never  Vaping Use   Vaping status: Never Used  Substance Use Topics   Alcohol use: No   Drug use: No     Allergies   Lisinopril  and Latex   Review of Systems Review of Systems  Constitutional:  Negative for chills and fever.  HENT:  Negative for ear pain and sore throat.   Eyes:  Negative for pain and visual disturbance.  Respiratory:  Negative for cough and shortness of breath.   Cardiovascular:  Negative for chest pain and palpitations.  Gastrointestinal:  Negative for abdominal pain and vomiting.  Genitourinary:  Negative for dysuria and hematuria.  Musculoskeletal:  Positive for back pain. Negative for arthralgias.  Skin:  Negative for color change and rash.  Neurological:  Negative for seizures and syncope.  All other systems reviewed and are negative.    Physical Exam Triage Vital Signs ED Triage Vitals [11/19/23 1342]  Encounter Vitals Group     BP (!) 148/99     Girls Systolic BP Percentile      Girls Diastolic BP Percentile      Boys Systolic BP Percentile      Boys Diastolic BP Percentile      Pulse Rate 78     Resp 16     Temp      Temp src      SpO2 96 %     Weight      Height 5' 4 (1.626 m)     Head Circumference      Peak Flow      Pain Score      Pain Loc      Pain Education      Exclude from Growth Chart    No data found.  Updated Vital Signs BP (!) 148/99 (BP Location: Right Arm)   Pulse 78   Resp 16   Ht 5' 4 (1.626 m)   SpO2 96%   BMI 33.13 kg/m   Visual Acuity Right Eye Distance:   Left Eye  Distance:   Bilateral Distance:    Right Eye Near:   Left Eye Near:    Bilateral Near:     Physical Exam Vitals reviewed.  Constitutional:      General: She  is not in acute distress.    Appearance: Normal appearance. She is not ill-appearing.  HENT:     Head: Normocephalic and atraumatic.  Pulmonary:     Effort: Pulmonary effort is normal.  Abdominal:     Tenderness: There is no abdominal tenderness. There is no right CVA tenderness, left CVA tenderness, guarding or rebound. Negative signs include Murphy's sign, Rovsing's sign and McBurney's sign.  Musculoskeletal:     Comments: There is bilateral lumbar paraspinous muscle pain with going from sitting to standing.  There is no midline spinous or bony tenderness.  Strength 5/5 upper and lower extremities.  Gait intact.  Neurological:     General: No focal deficit present.     Mental Status: She is alert and oriented to person, place, and time.  Psychiatric:        Mood and Affect: Mood normal.        Behavior: Behavior normal.        Thought Content: Thought content normal.        Judgment: Judgment normal.      UC Treatments / Results  Labs (all labs ordered are listed, but only abnormal results are displayed) Labs Reviewed  POCT URINE DIPSTICK - Abnormal; Notable for the following components:      Result Value   Leukocytes, UA Small (1+) (*)    All other components within normal limits  URINE CULTURE    EKG   Radiology No results found.  Procedures Procedures (including critical care time)  Medications Ordered in UC Medications  methylPREDNISolone sodium succinate (SOLU-MEDROL) 125 mg/2 mL injection 60 mg (60 mg Intramuscular Given 11/19/23 1406)    Initial Impression / Assessment and Plan / UC Course  I have reviewed the triage vital signs and the nursing notes.  Pertinent labs & imaging results that were available during my care of the patient were reviewed by me and considered in my medical decision  making (see chart for details).     Patient is a 48 year old female presenting with lumbar strain. The patient is afebrile and nontachycardic.  Antipyretic has not been administered today.  On exam, there is reproducible bilateral lumbar paraspinous muscle tenderness, with no midline spinous tenderness or deformity.  No trauma.  UA with small leuk.  Culture sent. IUD contraception.  Will manage with IM Solu-Medrol, baclofen, Tylenol /ibuprofen  as below.  Final Clinical Impressions(s) / UC Diagnoses   Final diagnoses:  Strain of lumbar region, initial encounter  IUD contraception     Discharge Instructions      -You can take the muscle relaxer Baclofen up to 3 times daily for muscle spasms and pain.  This medication will make you drowsy, so take it when you are home and do not need to drive. -You can take Tylenol  up to 1000 mg 3 times daily, and ibuprofen  up to 600 mg 3 times daily with food.  You can take these together, or alternate every 3-4 hours. -Heat, rest, stretching     ED Prescriptions     Medication Sig Dispense Auth. Provider   baclofen (LIORESAL) 10 MG tablet Take 1 tablet (10 mg total) by mouth 3 (three) times daily. 30 each Arlyss Leita BRAVO, PA-C      PDMP not reviewed this encounter.   Arlyss Leita BRAVO, PA-C 11/19/23 1431

## 2023-11-19 NOTE — Discharge Instructions (Addendum)
-  You can take the muscle relaxer Baclofen up to 3 times daily for muscle spasms and pain.  This medication will make you drowsy, so take it when you are home and do not need to drive. -You can take Tylenol  up to 1000 mg 3 times daily, and ibuprofen  up to 600 mg 3 times daily with food.  You can take these together, or alternate every 3-4 hours. -Heat, rest, stretching

## 2023-11-19 NOTE — ED Triage Notes (Signed)
 Pt c/o lower back pain for about 1 week. Pain is worse on right side. Denies any other symptoms or known injury

## 2023-11-20 LAB — URINE CULTURE: Culture: NO GROWTH

## 2023-11-23 ENCOUNTER — Ambulatory Visit (HOSPITAL_COMMUNITY): Payer: Self-pay

## 2023-12-02 DIAGNOSIS — Z1211 Encounter for screening for malignant neoplasm of colon: Secondary | ICD-10-CM | POA: Diagnosis not present

## 2023-12-02 DIAGNOSIS — Z113 Encounter for screening for infections with a predominantly sexual mode of transmission: Secondary | ICD-10-CM | POA: Diagnosis not present

## 2023-12-02 DIAGNOSIS — Z13220 Encounter for screening for lipoid disorders: Secondary | ICD-10-CM | POA: Diagnosis not present

## 2023-12-02 DIAGNOSIS — K119 Disease of salivary gland, unspecified: Secondary | ICD-10-CM | POA: Diagnosis not present

## 2023-12-02 DIAGNOSIS — E041 Nontoxic single thyroid nodule: Secondary | ICD-10-CM | POA: Diagnosis not present

## 2023-12-02 DIAGNOSIS — F419 Anxiety disorder, unspecified: Secondary | ICD-10-CM | POA: Diagnosis not present

## 2023-12-02 DIAGNOSIS — G43009 Migraine without aura, not intractable, without status migrainosus: Secondary | ICD-10-CM | POA: Diagnosis not present

## 2023-12-02 DIAGNOSIS — N898 Other specified noninflammatory disorders of vagina: Secondary | ICD-10-CM | POA: Diagnosis not present

## 2023-12-02 DIAGNOSIS — R7303 Prediabetes: Secondary | ICD-10-CM | POA: Diagnosis not present

## 2023-12-02 DIAGNOSIS — Z0000 Encounter for general adult medical examination without abnormal findings: Secondary | ICD-10-CM | POA: Diagnosis not present

## 2023-12-07 ENCOUNTER — Other Ambulatory Visit: Payer: Self-pay | Admitting: Family Medicine

## 2023-12-07 ENCOUNTER — Encounter: Payer: Self-pay | Admitting: Family Medicine

## 2023-12-07 DIAGNOSIS — K119 Disease of salivary gland, unspecified: Secondary | ICD-10-CM

## 2024-01-15 ENCOUNTER — Other Ambulatory Visit: Payer: Self-pay

## 2024-01-15 ENCOUNTER — Ambulatory Visit
Admission: EM | Admit: 2024-01-15 | Discharge: 2024-01-15 | Disposition: A | Discharge status: Home / Self Care | Attending: Physician Assistant | Admitting: Physician Assistant

## 2024-01-15 DIAGNOSIS — K0889 Other specified disorders of teeth and supporting structures: Secondary | ICD-10-CM | POA: Diagnosis not present

## 2024-01-15 MED ORDER — OXYCODONE-ACETAMINOPHEN 5-325 MG PO TABS: 1.0000 | ORAL_TABLET | Freq: Four times a day (QID) | ORAL | 0 refills | Status: AC | PRN

## 2024-01-15 NOTE — Discharge Instructions (Addendum)
 You were seen today for concerns of right sided dental pain.  At this time I do not see any obvious evidence of a dental infection but the area is extremely sensitive even to light percussion.  I recommend that you follow-up with your dentist next week for evaluation and more definitive management of your symptoms.  For now I am sending in a limited prescription for oxycodone -acetaminophen  5-325 mg for you to use up to every 6 hours for severe pain.  You can take this in addition to NSAID medications like ibuprofen , Motrin , naproxen per your preference. Please be advised that this can cause drowsiness and sedation so do not take it if you need to remain alert or drive and do not consume or use other sedating medications while you are using it. If you notice any of the following symptoms please return here or go to the ER: Swelling of the face, fevers, bad taste in the mouth, swelling of the gums, more severe pain that is not responding to pain relievers, drainage that looks like pus from around the tooth

## 2024-01-15 NOTE — ED Triage Notes (Signed)
 Pt presents with a chief complaint of toothache. Day three of sxs/pain. States she is having pain in tooth on right-upper side of mouth. Pain is beginning to radiate to the bottom right teeth. Currently rates pain an 8/10. Describes as a aching and throbbing sensation. OTC 500 mg Ibuprofen  taken + Orajel applied with temporary relief. No fevers.

## 2024-01-15 NOTE — ED Provider Notes (Signed)
 " [facility]    CSN: [id number] Arrival date & time: 01/15/24  1814      History   Chief Complaint Chief Complaint  Patient presents with   Dental Pain    HPI Carmen Lewis is a 49 y.o. female.   HPI Pt is here today with concern for dental pain. She states the pain is predominantly on the top of her right side but is radiating to the bottom now. She states she has been having pain for about 3 days now. She is not sure if this being caused by one tooth or multiple ones.She denies fever or chills but thinks she is having some right sided jaw swelling Interventions: Dual Ibuprofen -acetaminophen  combo, orajel  She has an apt with her dentist scheduled for Monday    Past Medical History:  Diagnosis Date   Contraceptive management    DVT (deep venous thrombosis) (HCC) 07/07/2010   right leg   Headache disorder    Hypertension    Osteoporosis    LEFT HIP    Patient Active Problem List   Diagnosis Date Noted   Major depressive disorder, recurrent, moderate (HCC) 11/19/2023   Irregular uterine bleeding 11/19/2023   Insect bite (nonvenomous), left foot, initial encounter (CODE) 11/19/2023   Anxiety 11/19/2023   Migraine without aura and with status migrainosus, not intractable 11/19/2023   Migraine aura without headache 11/19/2023   Neoplasm of uncertain behavior of skin 11/19/2023   Prediabetes 11/19/2023   Obesity 11/19/2023   Primary insomnia 11/19/2023   Skin tag 11/19/2023   Benign essential hypertension 11/19/2023   Sleep disturbance 11/19/2023   History of thromboembolism of vein 11/19/2023   Essential hypertension 07/18/2022   Palpitations 07/17/2022   Pain in finger of left hand 11/13/2021   Pain in joint of right shoulder 03/17/2019   Snoring 07/21/2018   Witnessed episode of apnea 07/21/2018   Excessive sleepiness 07/21/2018   Laryngopharyngeal reflux (LPR) 06/25/2018   Vestibular migraine 06/25/2018   Closed fracture of phalanx of foot  06/16/2018   Abnormal uterine bleeding 03/03/2017   Obesity (BMI 30-39.9) 01/28/2017   Hypertensive disorder 01/28/2017   History of DVT (deep vein thrombosis) 08/21/2015   History of deep venous thrombosis (DVT) of distal vein of right lower extremity 01/07/2012    Past Surgical History:  Procedure Laterality Date   BREAST BIOPSY Left    NO PAST SURGERIES     TUBAL LIGATION Bilateral 11/25/2015   Procedure: POST PARTUM TUBAL LIGATION, Excision Left Paratubal Cyst;  Surgeon: Burnard VEAR Pate, MD;  Location: [facility];  Service: Gynecology;  Laterality: Bilateral;    OB History     Gravida  5   Para  3   Term  3   Preterm      AB  2   Living  3      SAB  1   IAB  1   Ectopic      Multiple  0   Live Births  3            Home Medications    Prior to Admission medications  Medication Sig Start Date End Date Taking? Authorizing Provider  oxyCODONE -acetaminophen  (PERCOCET/ROXICET) 5-325 MG tablet Take 1 tablet by mouth every 6 (six) hours as needed for severe pain (pain score 7-10). 01/15/24  Yes Tim Wilhide E, PA-C  baclofen  (LIORESAL ) 10 MG tablet Take 1 tablet (10 mg total) by mouth 3 (three) times daily. 11/19/23   Graham, Laura E,  PA-C  fluconazole  (DIFLUCAN ) 150 MG tablet Take 150 mg by mouth once.    [provider]  hydrOXYzine  (ATARAX ) 25 MG tablet Take 0.5-1 tablets (12.5-25 mg total) by mouth every 8 (eight) hours as needed for anxiety. 08/04/23   Christopher Savannah, PA-C  metoprolol  tartrate (LOPRESSOR ) 25 MG tablet Take 1 tablet (25 mg total) by mouth 2 (two) times daily. 07/18/22 10/16/22  Patwardhan, Newman PARAS, MD  metoprolol  tartrate (LOPRESSOR ) 25 MG tablet Take 1 tablet by mouth 2 (two) times daily.    [provider]  metroNIDAZOLE  (FLAGYL ) 500 MG tablet Take 1 tablet (500 mg total) by mouth 2 (two) times daily. Patient not taking: Reported on 11/19/2023 08/04/23   Christopher Savannah, PA-C  olmesartan (BENICAR) 20 MG tablet Take 10 mg by mouth daily.     [provider]  sertraline (ZOLOFT) 25 MG tablet Take 25 mg by mouth daily.    [provider]  SUMAtriptan  (IMITREX ) 50 MG tablet Take 50 mg by mouth every 2 (two) hours as needed. 06/25/18   [provider]    Family History Family History  Problem Relation Age of Onset   Diabetes Mother    Hypertension Father    Hypertension Maternal Aunt    Breast cancer Maternal Aunt 4   Hypertension Paternal Uncle    Hypertension Maternal Grandmother    Hypertension Maternal Grandfather    Hypertension Paternal Grandmother     Social History Social History[1]   Allergies   Lisinopril  and Latex   Review of Systems Review of Systems  Constitutional:  Negative for chills and fever.  HENT:  Positive for dental problem.      Physical Exam Triage Vital Signs ED Triage Vitals  Encounter Vitals Group     BP 01/15/24 1844 (!) 136/91     Girls Systolic BP Percentile --      Girls Diastolic BP Percentile --      Boys Systolic BP Percentile --      Boys Diastolic BP Percentile --      Pulse Rate 01/15/24 1844 67     Resp 01/15/24 1844 15     Temp 01/15/24 1844 98.8 F (37.1 C)     Temp Source 01/15/24 1844 Oral     SpO2 01/15/24 1844 97 %     Weight 01/15/24 1844 175 lb (79.4 kg)     Height --      Head Circumference --      Peak Flow --      Pain Score 01/15/24 1900 8     Pain Loc --      Pain Education --      Exclude from Growth Chart --    No data found.  Updated Vital Signs BP (!) 136/91 (BP Location: Right Arm) Comment: Patient states its higher due to her pain right now  Pulse 67   Temp 98.8 F (37.1 C) (Oral)   Resp 15   Wt 175 lb (79.4 kg)   SpO2 97%   BMI 30.04 kg/m   Visual Acuity Right Eye Distance:   Left Eye Distance:   Bilateral Distance:    Right Eye Near:   Left Eye Near:    Bilateral Near:     Physical Exam Vitals reviewed.  Constitutional:      General: She is awake.     Appearance: Normal appearance. She is  well-developed and well-groomed.  HENT:     Head: Normocephalic and atraumatic.  Mouth/Throat:     Lips: Pink. No lesions.     Mouth: Mucous membranes are moist.     Dentition: Dental tenderness present. No gingival swelling or gum lesions.     Pharynx: Oropharynx is clear. Uvula midline. No pharyngeal swelling, oropharyngeal exudate, posterior oropharyngeal erythema, uvula swelling or postnasal drip.     Tonsils: No tonsillar exudate or tonsillar abscesses.   Eyes:     General: Lids are normal. Gaze aligned appropriately.     Extraocular Movements: Extraocular movements intact.     Conjunctiva/sclera: Conjunctivae normal.  Pulmonary:     Effort: Pulmonary effort is normal.  Neurological:     Mental Status: She is alert and oriented to person, place, and time.  Psychiatric:        Attention and Perception: Attention and perception normal.        Mood and Affect: Mood and affect normal.        Speech: Speech normal.        Behavior: Behavior normal. Behavior is cooperative.      UC Treatments / Results  Labs (all labs ordered are listed, but only abnormal results are displayed) Labs Reviewed - No data to display  EKG   Radiology No results found.  Procedures Procedures (including critical care time)  Medications Ordered in UC Medications - No data to display  Initial Impression / Assessment and Plan / UC Course  I have reviewed the triage vital signs and the nursing notes.  Pertinent labs & imaging results that were available during my care of the patient were reviewed by me and considered in my medical decision making (see chart for details).      Final Clinical Impressions(s) / UC Diagnoses   Final diagnoses:  Pain, dental   Patient is here today with concerns for right upper dental pain has been ongoing for the last 3 days.  She reports that the pain seems to be radiating down to the bottom right teeth and is not responding to OTC medications or Orajel.   Physical exam does not reveal any obvious broken teeth, dental caries, purulent drainage but there is significant percussion sensitivity on the tooth in question.  Patient states that she was previously told by her dentist that she would need a root canal on this tooth but did not get it at that time.  No obvious evidence of dental infection such as gingival swelling, fever, purulent drainage or facial swelling.  Will send patient in a prescription for oxycodone -acetaminophen  5-325 mg to be used for severe pain so that she can get some rest.  Reviewed that this medication is sedating.  PDMP reviewed and no obvious signs of concerning controlled substance use.  ED and return precautions reviewed and provided in AVS.  Follow-up as needed    Discharge Instructions      You were seen today for concerns of right sided dental pain.  At this time I do not see any obvious evidence of a dental infection but the area is extremely sensitive even to light percussion.  I recommend that you follow-up with your dentist next week for evaluation and more definitive management of your symptoms.  For now I am sending in a limited prescription for oxycodone -acetaminophen  5-325 mg for you to use up to every 6 hours for severe pain.  You can take this in addition to NSAID medications like ibuprofen , Motrin , naproxen per your preference. Please be advised that this can cause drowsiness and sedation so do not  take it if you need to remain alert or drive and do not consume or use other sedating medications while you are using it. If you notice any of the following symptoms please return here or go to the ER: Swelling of the face, fevers, bad taste in the mouth, swelling of the gums, more severe pain that is not responding to pain relievers, drainage that looks like pus from around the tooth     ED Prescriptions     Medication Sig Dispense Auth. Provider   oxyCODONE -acetaminophen  (PERCOCET/ROXICET) 5-325 MG tablet Take 1  tablet by mouth every 6 (six) hours as needed for severe pain (pain score 7-10). 6 tablet Karliah Kowalchuk E, PA-C      I have reviewed the PDMP during this encounter.     [1]  Social History Tobacco Use   Smoking status: Never   Smokeless tobacco: Never  Vaping Use   Vaping status: Never Used  Substance Use Topics   Alcohol use: No   Drug use: No     Destane Speas, Rocky BRAVO, PA-C 01/15/24 1947  "

## 2024-01-24 ENCOUNTER — Other Ambulatory Visit: Payer: Self-pay

## 2024-01-24 ENCOUNTER — Emergency Department (HOSPITAL_BASED_OUTPATIENT_CLINIC_OR_DEPARTMENT_OTHER)
Admission: EM | Admit: 2024-01-24 | Discharge: 2024-01-24 | Disposition: A | Discharge status: Home / Self Care | Attending: Emergency Medicine | Admitting: Emergency Medicine

## 2024-01-24 ENCOUNTER — Encounter (HOSPITAL_BASED_OUTPATIENT_CLINIC_OR_DEPARTMENT_OTHER): Payer: Self-pay

## 2024-01-24 DIAGNOSIS — N760 Acute vaginitis: Secondary | ICD-10-CM | POA: Insufficient documentation

## 2024-01-24 DIAGNOSIS — K148 Other diseases of tongue: Secondary | ICD-10-CM | POA: Diagnosis not present

## 2024-01-24 DIAGNOSIS — R1032 Left lower quadrant pain: Secondary | ICD-10-CM | POA: Diagnosis present

## 2024-01-24 DIAGNOSIS — I10 Essential (primary) hypertension: Secondary | ICD-10-CM | POA: Diagnosis not present

## 2024-01-24 DIAGNOSIS — Z79899 Other long term (current) drug therapy: Secondary | ICD-10-CM | POA: Insufficient documentation

## 2024-01-24 DIAGNOSIS — Z91040 Latex allergy status: Secondary | ICD-10-CM | POA: Insufficient documentation

## 2024-01-24 DIAGNOSIS — N941 Unspecified dyspareunia: Secondary | ICD-10-CM | POA: Insufficient documentation

## 2024-01-24 DIAGNOSIS — B9689 Other specified bacterial agents as the cause of diseases classified elsewhere: Secondary | ICD-10-CM | POA: Insufficient documentation

## 2024-01-24 DIAGNOSIS — K149 Disease of tongue, unspecified: Secondary | ICD-10-CM

## 2024-01-24 LAB — URINALYSIS, ROUTINE W REFLEX MICROSCOPIC
Bacteria, UA: NONE SEEN
Bilirubin Urine: NEGATIVE
Glucose, UA: NEGATIVE mg/dL
Ketones, ur: NEGATIVE mg/dL
Leukocytes,Ua: NEGATIVE
Nitrite: NEGATIVE
Protein, ur: NEGATIVE mg/dL
Specific Gravity, Urine: 1.024 (ref 1.005–1.030)
pH: 5.5 (ref 5.0–8.0)

## 2024-01-24 LAB — COMPREHENSIVE METABOLIC PANEL WITH GFR
ALT: 13 U/L (ref 0–44)
AST: 17 U/L (ref 15–41)
Albumin: 4.1 g/dL (ref 3.5–5.0)
Alkaline Phosphatase: 77 U/L (ref 38–126)
Anion gap: 10 (ref 5–15)
BUN: 16 mg/dL (ref 6–20)
CO2: 26 mmol/L (ref 22–32)
Calcium: 10.1 mg/dL (ref 8.9–10.3)
Chloride: 105 mmol/L (ref 98–111)
Creatinine, Ser: 0.85 mg/dL (ref 0.44–1.00)
GFR, Estimated: >60 mL/min
Glucose, Bld: 90 mg/dL (ref 70–99)
Potassium: 3.7 mmol/L (ref 3.5–5.1)
Sodium: 142 mmol/L (ref 135–145)
Total Bilirubin: 0.5 mg/dL (ref 0.0–1.2)
Total Protein: 7.2 g/dL (ref 6.5–8.1)

## 2024-01-24 LAB — CBC
HCT: 40.1 % (ref 36.0–46.0)
Hemoglobin: 13.8 g/dL (ref 12.0–15.0)
MCH: 30.5 pg (ref 26.0–34.0)
MCHC: 34.4 g/dL (ref 30.0–36.0)
MCV: 88.7 fL (ref 80.0–100.0)
Platelets: 232 K/uL (ref 150–400)
RBC: 4.52 MIL/uL (ref 3.87–5.11)
RDW: 12.6 % (ref 11.5–15.5)
WBC: 6.2 K/uL (ref 4.0–10.5)
nRBC: 0 % (ref 0.0–0.2)

## 2024-01-24 LAB — LIPASE, BLOOD: Lipase: 46 U/L (ref 11–51)

## 2024-01-24 LAB — WET PREP, GENITAL
Sperm: NONE SEEN
Trich, Wet Prep: NONE SEEN
WBC, Wet Prep HPF POC: <10
Yeast Wet Prep HPF POC: NONE SEEN

## 2024-01-24 LAB — PREGNANCY, URINE: Preg Test, Ur: NEGATIVE

## 2024-01-24 MED ORDER — METRONIDAZOLE 500 MG PO TABS: 500.0000 mg | ORAL_TABLET | Freq: Two times a day (BID) | ORAL | 0 refills | Status: DC

## 2024-01-24 NOTE — Discharge Instructions (Addendum)
 It was a pleasure meeting with you today.  Testing today confirms bacterial vaginosis or an infection of the vagina; there was no evidence of yeast or trichomonas.  I have sent antibiotic to your pharmacy.  Please take this medication as prescribed.  Follow-up with your OB/GYN for evaluation of resolution of bacterial vaginosis symptoms as well as ongoing management of pain with sexual intercourse as this may be due to perimenopausal hormonal changes.  Your gonorrhea and Chlamydia testing results are pending.  You can find these results on your MyChart when they are complete.  If tongue irritation persists I would recommend following up with your dentist for ongoing evaluation.  Return to ED if symptoms worsen or new concerning symptoms develop.

## 2024-01-24 NOTE — ED Provider Notes (Signed)
 " [place] EMERGENCY DEPARTMENT AT [facility] Provider Note   CSN: [id number] Arrival date & time: 01/24/24  1434     Patient presents with: Abdominal Pain   Carmen Lewis is a 49 y.o. female.  With pertinent medical history of DVT, palpitations, hypertension, MDD, irregular uterine bleeding, anxiety, migraine, prediabetes, and thromboembolism.   Patient is here for evaluation of multiple complaints today.    Her primary complaint is vaginal pain with intercourse last night.  Pain described as sharp to the bottom of the vaginal opening with penetration.  They were not using lubricant for protection.  She is concerned about potential exposure to STD as this was a new partner.  There was only penetrative vaginal intercourse.  Patient was treated in September 2025 for BV using metronidazole .  Patient admits she did not take this medication as directed and has continued to have vaginal discharge since that time.  Vaginal discharge is described as yellow in color.  She denies vaginal bleeding.  She has a IUD in place.  She reports left lower quadrant cramping pain for the last couple days.  She states that this is normal for her and often corresponds in timing with menstrual cycle.  Last bowel movement was earlier today and was normal for her.  Denies dysuria, hematuria, foul-smelling urine, or increase in urinary frequency.  Patient also concerned about area on her tongue that feels burnt and has been present since Tuesday of this week after eating hot pasta.  Denies bleeding in the mouth.  She also has noticed acne around her mouth which is abnormal for her.  Denies bumps within the groin.  She has also been having hot flashes.  Patient believes that she is perimenopausal.     The history is provided by the patient.  Abdominal Pain Pain location:  LLQ Pain quality: cramping        Prior to Admission medications  Medication Sig Start Date End Date Taking? Authorizing Provider   metroNIDAZOLE  (FLAGYL ) 500 MG tablet Take 1 tablet (500 mg total) by mouth 2 (two) times daily. 01/24/24  Yes Rosina Almarie LABOR, PA-C  baclofen  (LIORESAL ) 10 MG tablet Take 1 tablet (10 mg total) by mouth 3 (three) times daily. 11/19/23   Graham, Laura E, PA-C  fluconazole  (DIFLUCAN ) 150 MG tablet Take 150 mg by mouth once.    [provider]  hydrOXYzine  (ATARAX ) 25 MG tablet Take 0.5-1 tablets (12.5-25 mg total) by mouth every 8 (eight) hours as needed for anxiety. 08/04/23   Christopher Savannah, PA-C  metoprolol  tartrate (LOPRESSOR ) 25 MG tablet Take 1 tablet (25 mg total) by mouth 2 (two) times daily. 07/18/22 10/16/22  Patwardhan, Newman JINNY, MD  metoprolol  tartrate (LOPRESSOR ) 25 MG tablet Take 1 tablet by mouth 2 (two) times daily.    [provider]  olmesartan (BENICAR) 20 MG tablet Take 10 mg by mouth daily.    [provider]  oxyCODONE -acetaminophen  (PERCOCET/ROXICET) 5-325 MG tablet Take 1 tablet by mouth every 6 (six) hours as needed for severe pain (pain score 7-10). 01/15/24   Mecum, Erin E, PA-C  sertraline (ZOLOFT) 25 MG tablet Take 25 mg by mouth daily.    [provider]  SUMAtriptan  (IMITREX ) 50 MG tablet Take 50 mg by mouth every 2 (two) hours as needed. 06/25/18   [provider]    Allergies: Lisinopril  and Latex    Review of Systems  Gastrointestinal:  Positive for abdominal pain.    Updated Vital Signs  BP (!) 142/108 (BP Location: Right Arm)   Pulse 80   Temp 97.6 F (36.4 C)   Resp 16   Ht 5' 4 (1.626 m)   Wt 79.4 kg   SpO2 98%   BMI 30.04 kg/m   Physical Exam Vitals and nursing note reviewed. Exam conducted with a chaperone present Tax Adviser).  Constitutional:      General: She is not in acute distress.    Appearance: Normal appearance. She is well-developed. She is not ill-appearing, toxic-appearing or diaphoretic.  HENT:     Head: Normocephalic and atraumatic.     Mouth/Throat:     Lips: No lesions.     Mouth:  Mucous membranes are moist. No injury, lacerations, oral lesions or angioedema.     Tongue: Tongue does not deviate from midline.     Palate: No mass and lesions.     Pharynx: Oropharynx is clear.      Comments: Metal braces in place. Small irritation lesion of the tongue without erythema, swelling, or drainage. Tender with tongue depressor palpation. Eyes:     General: No scleral icterus.    Extraocular Movements: Extraocular movements intact.     Conjunctiva/sclera: Conjunctivae normal.  Cardiovascular:     Rate and Rhythm: Normal rate and regular rhythm.     Pulses: Normal pulses.     Heart sounds: Normal heart sounds.  Pulmonary:     Effort: Pulmonary effort is normal. No respiratory distress.     Breath sounds: Normal breath sounds. No stridor. No wheezing, rhonchi or rales.  Abdominal:     General: Abdomen is flat. Bowel sounds are normal. There is no distension. There are no signs of injury.     Palpations: Abdomen is soft.     Tenderness: There is no abdominal tenderness. There is no guarding. Negative signs include Murphy's sign, Rovsing's sign and McBurney's sign.  Genitourinary:    General: Normal vulva.     Exam position: Lithotomy position.     Pubic Area: No rash or pubic lice.      Labia:        Right: No rash, tenderness, lesion or injury.        Left: No rash, tenderness, lesion or injury.      Urethra: No prolapse, urethral pain, urethral swelling or urethral lesion.     Vagina: No signs of injury and foreign body. Tenderness (Pain with insertion of speculum, localized to bottom of vaginal opening.) present. No vaginal discharge, erythema, bleeding or lesions.     Cervix: Normal. No cervical motion tenderness, discharge (No obvious discharge), friability, lesion, erythema, cervical bleeding or eversion.     Uterus: Not tender.      Adnexa:        Right: No tenderness.         Left: No tenderness.       Comments: No discrete vaginal atrophy noted. Skin:     General: Skin is warm and dry.     Capillary Refill: Capillary refill takes less than 2 seconds.     Coloration: Skin is not jaundiced or pale.  Neurological:     Mental Status: She is alert and oriented to person, place, and time.     (all labs ordered are listed, but only abnormal results are displayed) Labs Reviewed  WET PREP, GENITAL - Abnormal; Notable for the following components:      Result Value   Clue Cells Wet Prep HPF POC PRESENT (*)    All  other components within normal limits  URINALYSIS, ROUTINE W REFLEX MICROSCOPIC - Abnormal; Notable for the following components:   Hgb urine dipstick SMALL (*)    All other components within normal limits  LIPASE, BLOOD  COMPREHENSIVE METABOLIC PANEL WITH GFR  CBC  PREGNANCY, URINE  GC/CHLAMYDIA PROBE AMP ([place]) NOT AT [facility]    EKG: None  Radiology: No results found.   Procedures   Medications Ordered in the ED - No data to display   Patient presents to the ED for concern of ***, this involves an extensive number of treatment options, and is a complaint that carries with it a high risk of complications and morbidity.  The differential diagnosis includes ***   Co morbidities that complicate the patient evaluation  ***   Additional history obtained:  Additional history obtained from *** {Blank multiple:19196::EMS,Family,Nursing,Outside Medical Records,Past Admission}   External records from outside source obtained and reviewed including ***   Lab Tests:  I Ordered, and personally interpreted labs.  The pertinent results include: Blood work is largely unremarkable with normal LFTs, normal lipase, no leukocytosis, no anion gap, and normal creatinine.  Urinalysis is not indicative of infection.  Urine pregnancy negative.   Imaging Studies ordered:  I ordered imaging studies including ***  I independently visualized and interpreted imaging which showed *** I agree with the radiologist  interpretation   Cardiac Monitoring:  The patient was maintained on a cardiac monitor.  I personally viewed and interpreted the cardiac monitored which showed an underlying rhythm of: ***   Medicines ordered and prescription drug management:  I ordered medication including ***  for ***  Reevaluation of the patient after these medicines showed that the patient {resolved/improved/worsened:23923::improved} I have reviewed the patients home medicines and have made adjustments as needed   Consultations Obtained:  I requested consultation with the ***,  and discussed lab and imaging findings as well as pertinent plan - they recommend: ***   Problem List / ED Course:     Dyspareunia. *** Irritation lesion of the tongue. Does not appear to be infected and appears to be well-healing though still tender. Encouraged patient to follow-up with her dentist if irritation persists.   Reevaluation:  After the interventions noted above, I reevaluated the patient and found that they have :{resolved/improved/worsened:23923::improved}   Social Determinants of Health:  Medicaid use   Dispostion:  After consideration of the diagnostic results and the patients response to treatment, I feel that the patent would benefit from ***.     {Click here for ABCD2, HEART and other calculators REFRESH Note before signing:1}                              Medical Decision Making Amount and/or Complexity of Data Reviewed Labs: ordered.   This note was produced using Electronics Engineer. While the provider has reviewed and verified all clinical information, transcription errors may remain.    Final diagnoses:  Bacterial vaginosis  Dyspareunia in female    ED Discharge Orders          Ordered    metroNIDAZOLE  (FLAGYL ) 500 MG tablet  2 times daily        01/24/24 1818             "

## 2024-01-24 NOTE — ED Triage Notes (Signed)
 Pt reports LLQ abd pain x1 day. Pt denies any N/V/D.

## 2024-01-26 LAB — GC/CHLAMYDIA PROBE AMP (~~LOC~~) NOT AT ARMC
Chlamydia: NEGATIVE
Comment: NEGATIVE
Comment: NORMAL
Neisseria Gonorrhea: NEGATIVE

## 2024-02-08 ENCOUNTER — Ambulatory Visit
Admission: EM | Admit: 2024-02-08 | Discharge: 2024-02-08 | Disposition: A | Discharge status: Home / Self Care | Source: Home / Self Care

## 2024-02-08 DIAGNOSIS — H1031 Unspecified acute conjunctivitis, right eye: Secondary | ICD-10-CM

## 2024-02-08 DIAGNOSIS — H00033 Abscess of eyelid right eye, unspecified eyelid: Secondary | ICD-10-CM

## 2024-02-08 MED ORDER — AMOXICILLIN-POT CLAVULANATE 875-125 MG PO TABS: 1.0000 | ORAL_TABLET | Freq: Two times a day (BID) | ORAL | 0 refills | Status: AC

## 2024-02-08 MED ORDER — OFLOXACIN 0.3 % OP SOLN: 1.0000 [drp] | Freq: Four times a day (QID) | OPHTHALMIC | 0 refills | Status: AC

## 2024-02-08 NOTE — ED Triage Notes (Signed)
 Pt present with c/o rt eye discomfort starting Saturday. Pt used eye drops and woke up this morning with eye swelling and discharge. Pt states she feels soreness.

## 2024-02-08 NOTE — ED Provider Notes (Signed)
 " [facility]    CSN: [id number] Arrival date & time: 02/08/24  1118      History   Chief Complaint Chief Complaint  Patient presents with   Eye Problem    HPI Carmen Lewis is a 49 y.o. female presents for eye redness.  Patient reports 2 days ago she developed some right eyelid soreness/eye irritation that worsened today.  She reports swelling of the right upper eyelid with continued soreness as well as redness of the eye and itchy watery drainage.  No fevers, chills, URI or allergy symptoms.  No visual changes such as blurry or double vision.  States she has had intermittent toothache and does know that she needs a root canal.  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  She has been doing over-the-counter drops with minimal improvement.  No other concerns.   Eye Problem Associated symptoms: discharge and redness     Past Medical History:  Diagnosis Date   Contraceptive management    DVT (deep venous thrombosis) (HCC) 07/07/2010   right leg   Headache disorder    Hypertension    Osteoporosis    LEFT HIP    Patient Active Problem List   Diagnosis Date Noted   Major depressive disorder, recurrent, moderate (HCC) 11/19/2023   Irregular uterine bleeding 11/19/2023   Insect bite (nonvenomous), left foot, initial encounter (CODE) 11/19/2023   Anxiety 11/19/2023   Migraine without aura and with status migrainosus, not intractable 11/19/2023   Migraine aura without headache 11/19/2023   Neoplasm of uncertain behavior of skin 11/19/2023   Prediabetes 11/19/2023   Obesity 11/19/2023   Primary insomnia 11/19/2023   Skin tag 11/19/2023   Benign essential hypertension 11/19/2023   Sleep disturbance 11/19/2023   History of thromboembolism of vein 11/19/2023   Essential hypertension 07/18/2022   Palpitations 07/17/2022   Pain in finger of left hand 11/13/2021   Pain in joint of right shoulder 03/17/2019   Snoring 07/21/2018   Witnessed  episode of apnea 07/21/2018   Excessive sleepiness 07/21/2018   Laryngopharyngeal reflux (LPR) 06/25/2018   Vestibular migraine 06/25/2018   Closed fracture of phalanx of foot 06/16/2018   Abnormal uterine bleeding 03/03/2017   Obesity (BMI 30-39.9) 01/28/2017   Hypertensive disorder 01/28/2017   History of DVT (deep vein thrombosis) 08/21/2015   History of deep venous thrombosis (DVT) of distal vein of right lower extremity 01/07/2012    Past Surgical History:  Procedure Laterality Date   BREAST BIOPSY Left    NO PAST SURGERIES     TUBAL LIGATION Bilateral 11/25/2015   Procedure: POST PARTUM TUBAL LIGATION, Excision Left Paratubal Cyst;  Surgeon: Burnard VEAR Pate, MD;  Location: [facility];  Service: Gynecology;  Laterality: Bilateral;    OB History     Gravida  5   Para  3   Term  3   Preterm      AB  2   Living  3      SAB  1   IAB  1   Ectopic      Multiple  0   Live Births  3            Home Medications    Prior to Admission medications  Medication Sig Start Date End Date Taking? Authorizing Provider  amoxicillin -clavulanate (AUGMENTIN ) 875-125 MG tablet Take 1 tablet by mouth every 12 (twelve) hours. 02/08/24  Yes Jed Kutch, Jodi R, NP  ofloxacin  (OCUFLOX ) 0.3 % ophthalmic  solution Place 1 drop into the right eye 4 (four) times daily for 5 days. 02/08/24 02/13/24 Yes Amarria Andreasen, Jodi R, NP  baclofen  (LIORESAL ) 10 MG tablet Take 1 tablet (10 mg total) by mouth 3 (three) times daily. 11/19/23   Graham, Laura E, PA-C  fluconazole  (DIFLUCAN ) 150 MG tablet Take 150 mg by mouth once.    [provider]  hydrOXYzine  (ATARAX ) 25 MG tablet Take 0.5-1 tablets (12.5-25 mg total) by mouth every 8 (eight) hours as needed for anxiety. 08/04/23   Christopher Savannah, PA-C  metoprolol  tartrate (LOPRESSOR ) 25 MG tablet Take 1 tablet (25 mg total) by mouth 2 (two) times daily. 07/18/22 10/16/22  Patwardhan, Newman PARAS, MD  metoprolol  tartrate (LOPRESSOR ) 25 MG tablet Take 1 tablet by mouth  2 (two) times daily.    [provider]  olmesartan (BENICAR) 20 MG tablet Take 10 mg by mouth daily.    [provider]  oxyCODONE -acetaminophen  (PERCOCET/ROXICET) 5-325 MG tablet Take 1 tablet by mouth every 6 (six) hours as needed for severe pain (pain score 7-10). 01/15/24   Mecum, Erin E, PA-C  sertraline (ZOLOFT) 25 MG tablet Take 25 mg by mouth daily.    [provider]  SUMAtriptan  (IMITREX ) 50 MG tablet Take 50 mg by mouth every 2 (two) hours as needed. 06/25/18   [provider]    Family History Family History  Problem Relation Age of Onset   Diabetes Mother    Hypertension Father    Hypertension Maternal Aunt    Breast cancer Maternal Aunt 42   Hypertension Paternal Uncle    Hypertension Maternal Grandmother    Hypertension Maternal Grandfather    Hypertension Paternal Grandmother     Social History Social History[1]   Allergies   Lisinopril  and Latex   Review of Systems Review of Systems  Eyes:  Positive for discharge and redness.     Physical Exam Triage Vital Signs ED Triage Vitals  Encounter Vitals Group     BP 02/08/24 1134 (!) 140/96     Girls Systolic BP Percentile --      Girls Diastolic BP Percentile --      Boys Systolic BP Percentile --      Boys Diastolic BP Percentile --      Pulse Rate 02/08/24 1134 88     Resp 02/08/24 1134 16     Temp 02/08/24 1134 99.1 F (37.3 C)     Temp Source 02/08/24 1134 Oral     SpO2 02/08/24 1134 95 %     Weight --      Height --      Head Circumference --      Peak Flow --      Pain Score 02/08/24 1133 7     Pain Loc --      Pain Education --      Exclude from Growth Chart --    No data found.  Updated Vital Signs BP (!) 140/96 (BP Location: Right Arm)   Pulse 88   Temp 99.1 F (37.3 C) (Oral)   Resp 16   LMP  (LMP Unknown)   SpO2 95%   Visual Acuity Right Eye Distance:   Left Eye Distance:   Bilateral Distance:    Right Eye Near:   Left Eye Near:     Bilateral Near:     Physical Exam Vitals and nursing note reviewed.  Constitutional:      Appearance: Normal appearance.  HENT:  Head: Normocephalic and atraumatic.  Eyes:     General:        Right eye: Discharge present. No foreign body or hordeolum.     Conjunctiva/sclera:     Right eye: Right conjunctiva is injected. No chemosis, exudate or hemorrhage.    Pupils: Pupils are equal, round, and reactive to light.      Comments: There is moderate swelling with mild erythema of the right eyelid without periorbital swelling.  Cardiovascular:     Rate and Rhythm: Normal rate.  Pulmonary:     Effort: Pulmonary effort is normal.  Skin:    General: Skin is warm and dry.  Neurological:     General: No focal deficit present.     Mental Status: She is alert and oriented to person, place, and time.  Psychiatric:        Mood and Affect: Mood normal.        Behavior: Behavior normal.      UC Treatments / Results  Labs (all labs ordered are listed, but only abnormal results are displayed) Labs Reviewed - No data to display  EKG   Radiology No results found.  Procedures Procedures (including critical care time)  Medications Ordered in UC Medications - No data to display  Initial Impression / Assessment and Plan / UC Course  I have reviewed the triage vital signs and the nursing notes.  Pertinent labs & imaging results that were available during my care of the patient were reviewed by me and considered in my medical decision making (see chart for details).     Reviewed exam and symptoms with patient.  Will do Augmentin  for eyelid cellulitis as well as ofloxacin  antibiotic eyedrops 4 times a day for 5 days.  Cool compresses as needed.  PCP follow-up 2 to 3 days for recheck.  ER precautions reviewed. Final Clinical Impressions(s) / UC Diagnoses   Final diagnoses:  Eyelid cellulitis, right  Acute conjunctivitis of right eye, unspecified acute conjunctivitis type      Discharge Instructions      Start Augmentin  antibiotic twice daily for 7 days.  Also start antibiotic eyedrops 4 times a day for 5 days.  Cool compresses to the eye as needed.  Follow-up with your PCP in 2 to 3 days for recheck.  Please go to the ER for any worsening symptoms.  Hope you feel better soon!    ED Prescriptions     Medication Sig Dispense Auth. Provider   amoxicillin -clavulanate (AUGMENTIN ) 875-125 MG tablet Take 1 tablet by mouth every 12 (twelve) hours. 14 tablet Clytee Heinrich, Jodi R, NP   ofloxacin  (OCUFLOX ) 0.3 % ophthalmic solution Place 1 drop into the right eye 4 (four) times daily for 5 days. 5 mL Azrael Maddix, Jodi R, NP      PDMP not reviewed this encounter.    [1]  Social History Tobacco Use   Smoking status: Never   Smokeless tobacco: Never  Vaping Use   Vaping status: Never Used  Substance Use Topics   Alcohol use: No   Drug use: No     Loreda Myla SAUNDERS, NP 02/08/24 1219  "
